# Patient Record
Sex: Female | Born: 1990 | Race: White | Hispanic: No | Marital: Married | Smoking: Never smoker
Health system: Southern US, Community
[De-identification: ages and names within clinical notes are randomized; demographics above are authoritative.]

## PROBLEM LIST (undated history)

## (undated) ENCOUNTER — Inpatient Hospital Stay: Payer: Self-pay

## (undated) DIAGNOSIS — G43909 Migraine, unspecified, not intractable, without status migrainosus: Secondary | ICD-10-CM

## (undated) DIAGNOSIS — E039 Hypothyroidism, unspecified: Secondary | ICD-10-CM

## (undated) HISTORY — DX: Migraine, unspecified, not intractable, without status migrainosus: G43.909

## (undated) HISTORY — PX: NO PAST SURGERIES: SHX2092

---
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
## 2009-06-05 ENCOUNTER — Other Ambulatory Visit: Payer: Self-pay | Admitting: Unknown Physician Specialty

## 2009-09-13 ENCOUNTER — Other Ambulatory Visit: Payer: Self-pay | Admitting: Unknown Physician Specialty

## 2010-03-13 ENCOUNTER — Other Ambulatory Visit: Payer: Self-pay | Admitting: Unknown Physician Specialty

## 2010-09-18 ENCOUNTER — Other Ambulatory Visit: Payer: Self-pay | Admitting: Unknown Physician Specialty

## 2011-04-01 ENCOUNTER — Other Ambulatory Visit: Payer: Self-pay | Admitting: Family Medicine

## 2011-12-01 ENCOUNTER — Other Ambulatory Visit: Payer: Self-pay | Admitting: Family Medicine

## 2011-12-01 LAB — TSH: Thyroid Stimulating Horm: 8.33 u[IU]/mL — ABNORMAL HIGH

## 2012-02-27 ENCOUNTER — Other Ambulatory Visit: Payer: Self-pay | Admitting: Family Medicine

## 2012-02-27 LAB — TSH: Thyroid Stimulating Horm: 0.472 u[IU]/mL

## 2012-11-16 ENCOUNTER — Other Ambulatory Visit: Payer: Self-pay | Admitting: Family Medicine

## 2012-11-16 LAB — TSH: Thyroid Stimulating Horm: 6.1 u[IU]/mL — ABNORMAL HIGH

## 2013-06-07 ENCOUNTER — Emergency Department: Payer: Self-pay | Admitting: Emergency Medicine

## 2013-06-07 LAB — CBC
HGB: 15.2 g/dL (ref 12.0–16.0)
MCH: 29.9 pg (ref 26.0–34.0)
Platelet: 172 10*3/uL (ref 150–440)
RBC: 5.11 10*6/uL (ref 3.80–5.20)
RDW: 13.2 % (ref 11.5–14.5)
WBC: 8.4 10*3/uL (ref 3.6–11.0)

## 2013-06-07 LAB — LIPASE, BLOOD: Lipase: 111 U/L (ref 73–393)

## 2013-06-07 LAB — COMPREHENSIVE METABOLIC PANEL
Albumin: 4.1 g/dL (ref 3.4–5.0)
Alkaline Phosphatase: 98 U/L (ref 50–136)
BUN: 11 mg/dL (ref 7–18)
Calcium, Total: 9.2 mg/dL (ref 8.5–10.1)
Chloride: 107 mmol/L (ref 98–107)
EGFR (African American): >60
Glucose: 91 mg/dL (ref 65–99)
Osmolality: 273 (ref 275–301)

## 2013-06-07 LAB — URINALYSIS, COMPLETE
Bilirubin,UR: NEGATIVE
Blood: NEGATIVE
Glucose,UR: NEGATIVE mg/dL (ref 0–75)
Leukocyte Esterase: NEGATIVE
Ph: 5 (ref 4.5–8.0)
Specific Gravity: 1.018 (ref 1.003–1.030)
Squamous Epithelial: 1
WBC UR: NONE SEEN /HPF (ref 0–5)

## 2013-06-17 ENCOUNTER — Ambulatory Visit: Payer: Self-pay | Admitting: Gastroenterology

## 2014-07-27 ENCOUNTER — Ambulatory Visit: Payer: Self-pay

## 2015-01-04 IMAGING — CT CT ABD-PELV W/ CM
2 of 4 series · 16 of 46 positions shown, 18 images · IV contrast (isovue)
Comparison: None.

CLINICAL DATA: Right upper quadrant pain and epigastric pain.
Nausea and vomiting.

EXAM:
CT ABDOMEN AND PELVIS WITH CONTRAST
TECHNIQUE: Multidetector CT imaging of the abdomen and pelvis was performed
using the standard protocol following bolus administration of
intravenous contrast.
CONTRAST:  100 cc Isovue 370

[Series 2: routine abd pel with · axial · 0.56mm/px · z∈[-956,-566]mm · 13 of 86 slices shown, 15 images]
[im 4/86  soft-tissue]
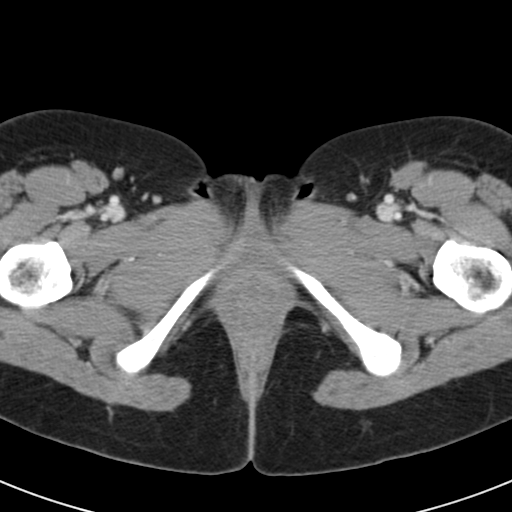
[im 4/86  bone]
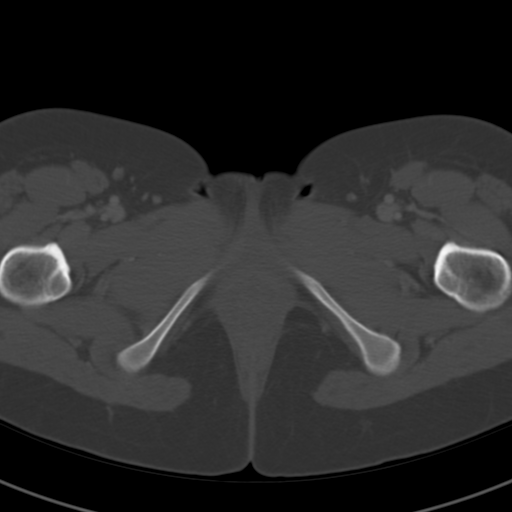
[im 11/86  soft-tissue]
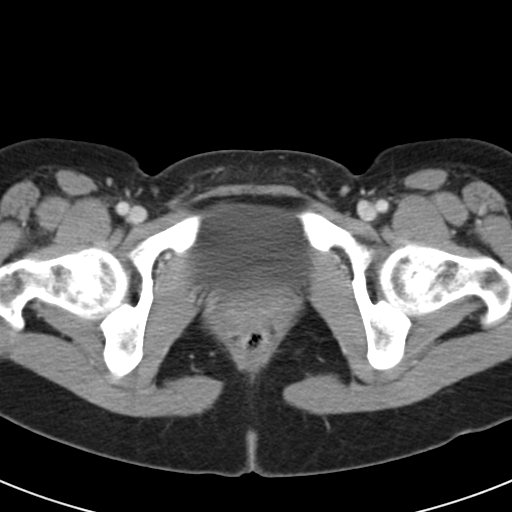
[im 18/86  soft-tissue]
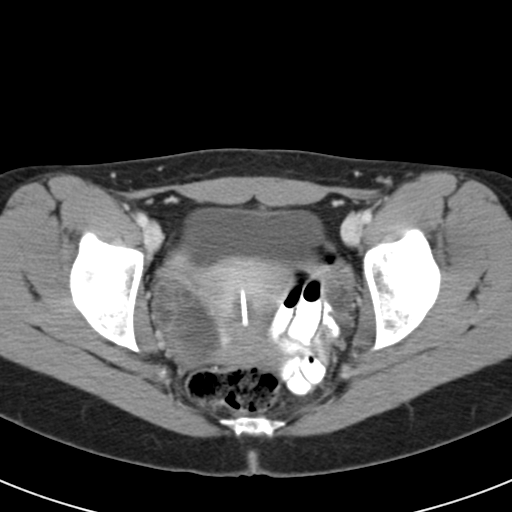
[im 24/86  soft-tissue]
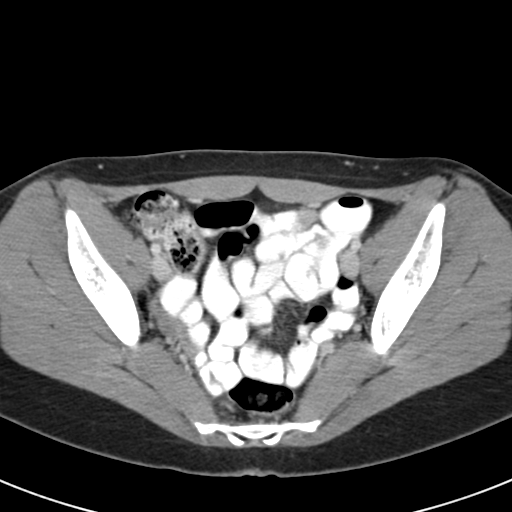
[im 31/86  soft-tissue]
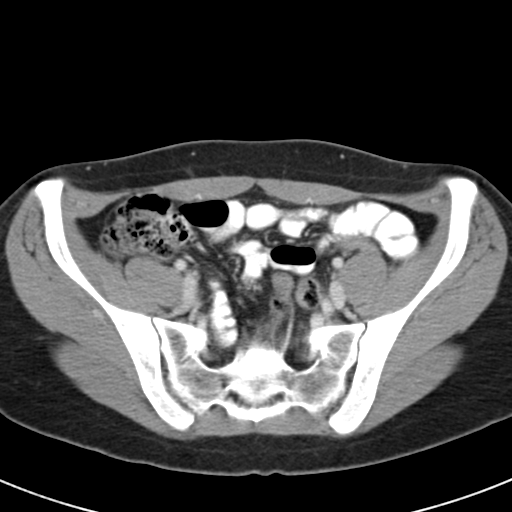
[im 38/86  soft-tissue]
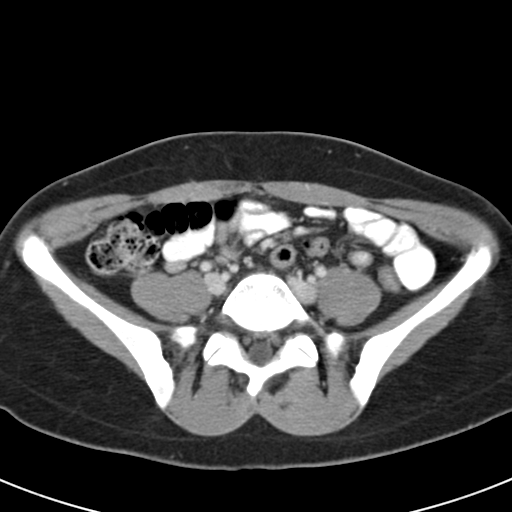
[im 45/86  soft-tissue]
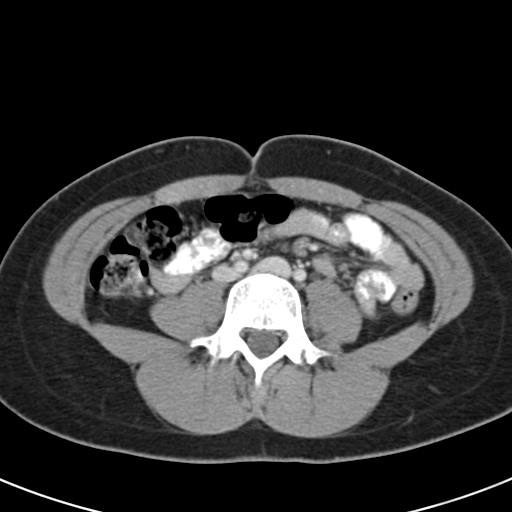
[im 48/86  soft-tissue]
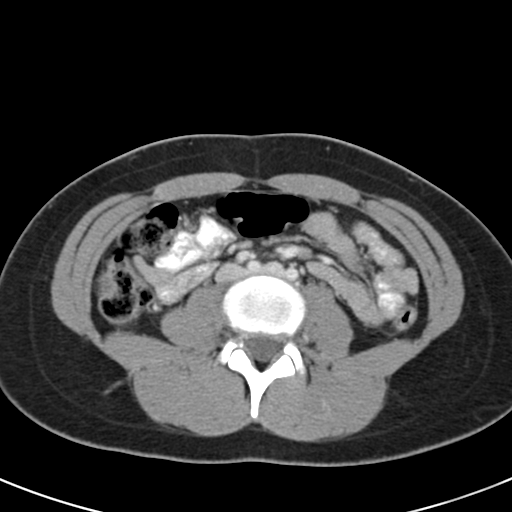
[im 55/86  soft-tissue]
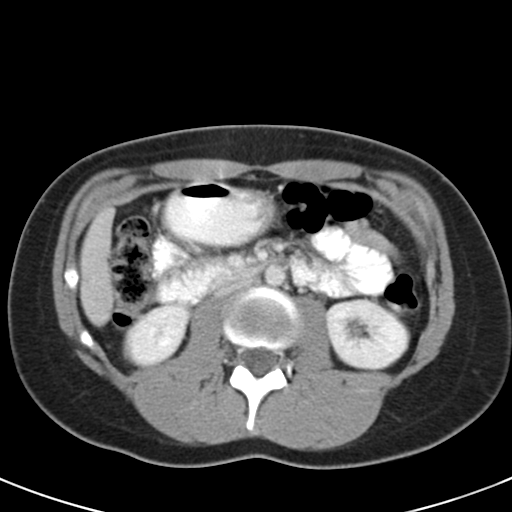
[im 55/86  bone]
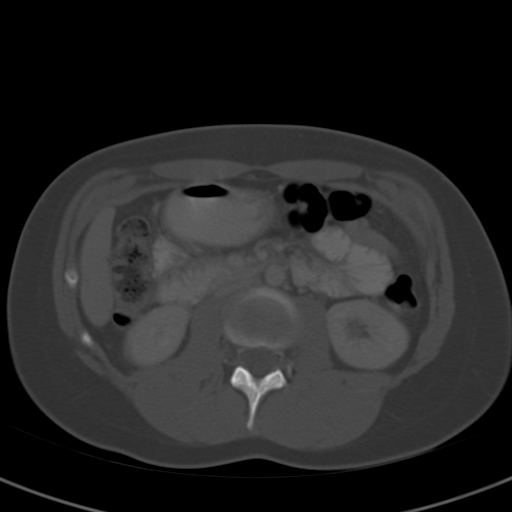
[im 62/86  soft-tissue]
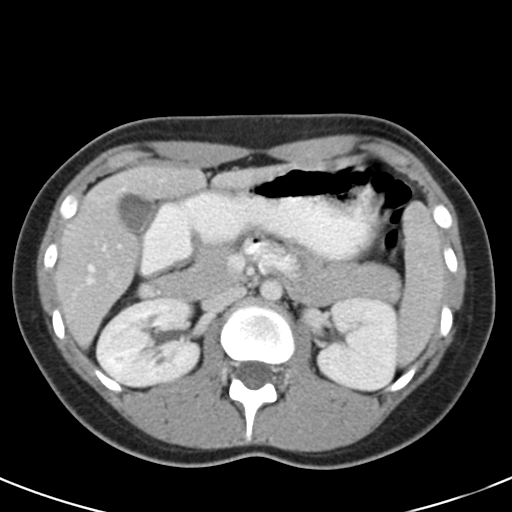
[im 69/86  soft-tissue]
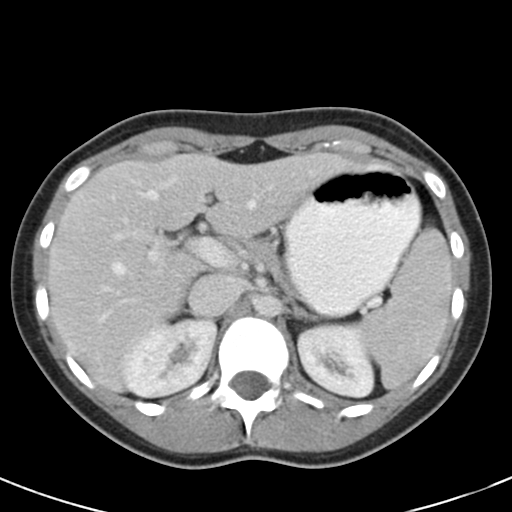
[im 75/86  soft-tissue]
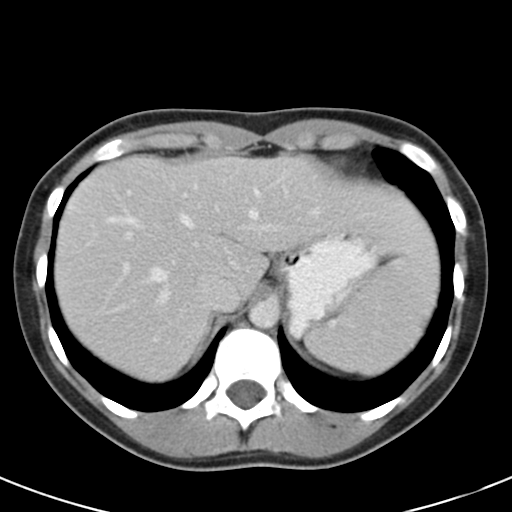
[im 82/86  soft-tissue]
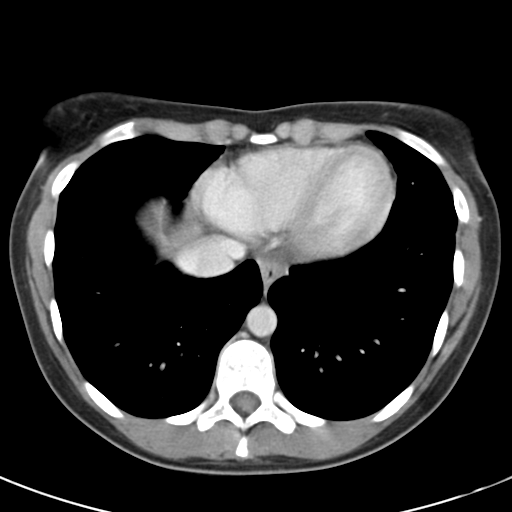

[Series 5: cor routine abd pel with · coronal · 0.57mm/px · 3 of 86 slices shown]
[im 29/86  soft-tissue]
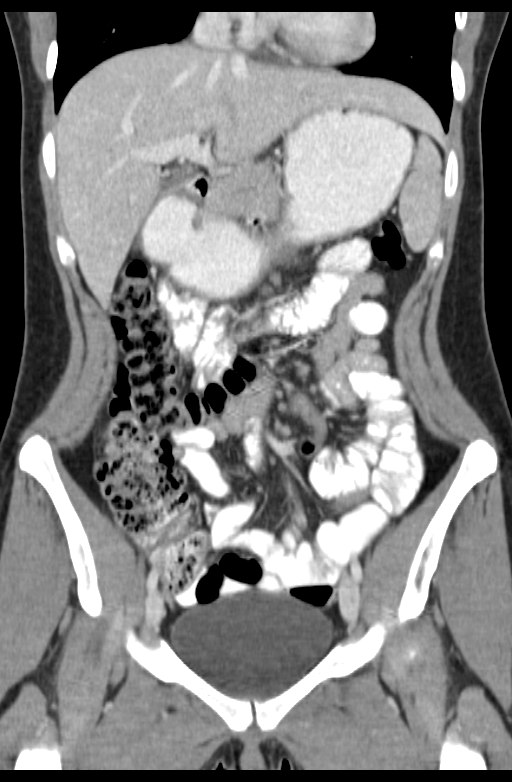
[im 38/86  soft-tissue]
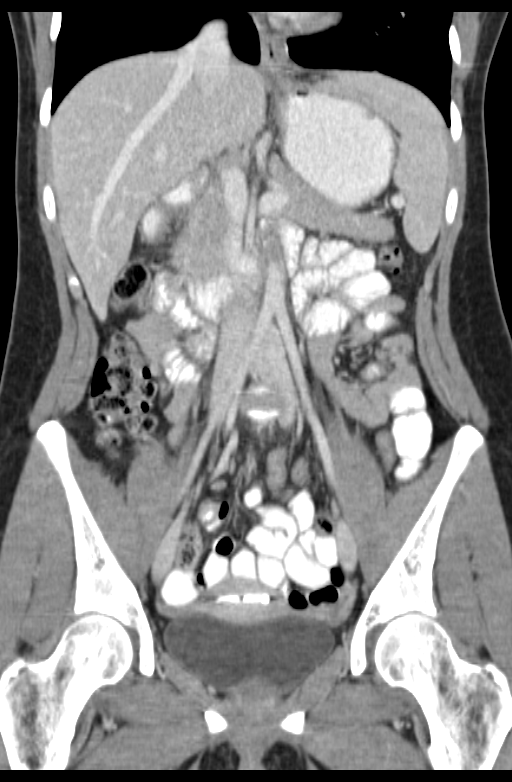
[im 48/86  soft-tissue]
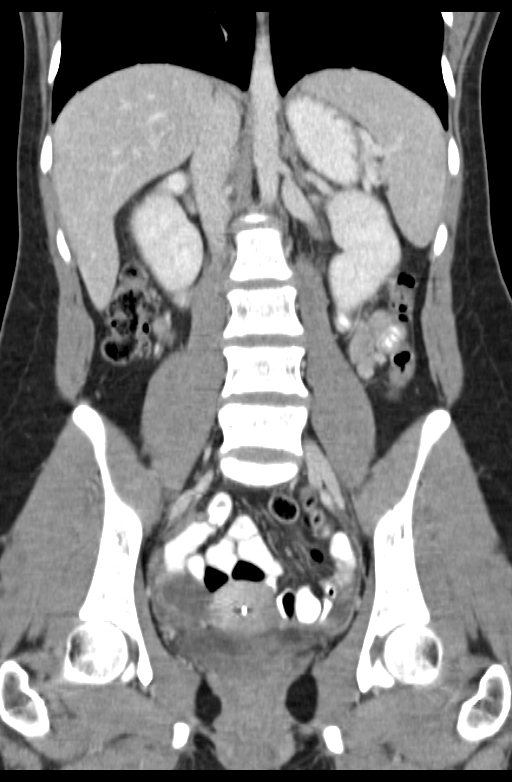

[16 of 46 positions shown; findings below may reference images not displayed]

FINDINGS: The liver, gallbladder, spleen, pancreas, adrenal glands, and
kidneys are normal in appearance. No evidence of hydronephrosis.

The IUD is seen the expected position in the uterus. A benign
appearing cyst is seen in the right adnexa measuring 2.7 x 4.1 cm.
No other cystic or solid masses are identified within the abdomen or
pelvis. No evidence of lymphadenopathy.

No evidence of inflammatory process or abnormal fluid collections.
No evidence of bowel wall thickening, dilatation, or hernia.
IMPRESSION: 4.1 cm benign appearing right adnexal cyst, most likely a functional
ovarian cyst. No further imaging followup required in a reproductive
age female. This recommendation follows ACR consensus guidelines:
White Paper of the ACR Incidental Findings Committee II on Adnexal
Findings. [HOSPITAL] [DATE].

IUD in normal location.

## 2015-01-14 IMAGING — NM NUCLEAR MEDICINE HEPATOHBILIARY INCLUDE GB
2 series · 12 of 12 positions shown · non-contrast
Comparison: CT abdomen pelvis and ultrasound abdomen 06/07/2013.

RADIOPHARMACEUTICALS:  X.GmBi Qc-11m Choletec

CLINICAL DATA: Right upper quadrant pain with nausea.

EXAM:
NUCLEAR MEDICINE HEPATOBILIARY IMAGING WITH GALLBLADDER EF
TECHNIQUE: Sequential images of the abdomen were obtained [DATE] minutes
following intravenous administration of radiopharmaceutical. After
oral ingestion of Ensure, gallbladder ejection fraction was
determined. At 60 min, normal ejection fraction is greater than 33%.

[Series 1000: gallbladder dynamic (results) · 4.80mm/px · 6 of 120 frames shown]
[frame 11/120]
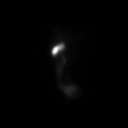
[frame 31/120]
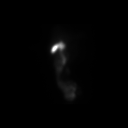
[frame 51/120]
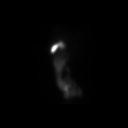
[frame 71/120]
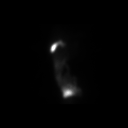
[frame 91/120]
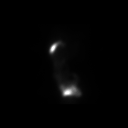
[frame 111/120]
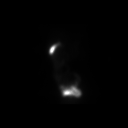

[Series 1000: gallbladder dynamic · 4.80mm/px · 6 of 120 frames shown]
[frame 11/120]
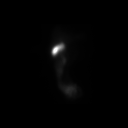
[frame 31/120]
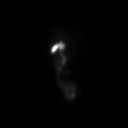
[frame 51/120]
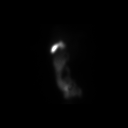
[frame 71/120]
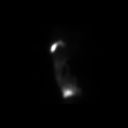
[frame 91/120]
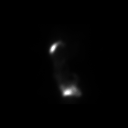
[frame 111/120]
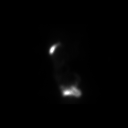

[12 of 12 positions shown; findings below may reference images not displayed]

FINDINGS: There is uniform uptake in the liver. Gallbladder is initially
visualized at 10 min. Bowel activity is seen at 50 min. Gallbladder
ejection fraction: 55%. Normal gallbladder ejection fraction with
Ensure is greater than 33%. The patient did experience symptoms
after oral ingestion of Ensure.
IMPRESSION: No evidence of cholecystitis.  Normal gallbladder ejection fraction.

## 2015-05-25 LAB — OB RESULTS CONSOLE RUBELLA ANTIBODY, IGM: Rubella: IMMUNE

## 2015-05-25 LAB — OB RESULTS CONSOLE HIV ANTIBODY (ROUTINE TESTING): HIV: NONREACTIVE

## 2015-05-25 LAB — OB RESULTS CONSOLE VARICELLA ZOSTER ANTIBODY, IGG: Varicella: IMMUNE

## 2015-05-25 LAB — OB RESULTS CONSOLE GC/CHLAMYDIA
Chlamydia: NEGATIVE
GC PROBE AMP, GENITAL: NEGATIVE

## 2015-05-25 LAB — OB RESULTS CONSOLE RPR: RPR: NONREACTIVE

## 2015-05-25 LAB — OB RESULTS CONSOLE HEPATITIS B SURFACE ANTIGEN: Hepatitis B Surface Ag: NEGATIVE

## 2015-05-28 ENCOUNTER — Other Ambulatory Visit: Payer: Self-pay | Admitting: Obstetrics and Gynecology

## 2015-05-28 DIAGNOSIS — Z369 Encounter for antenatal screening, unspecified: Secondary | ICD-10-CM

## 2015-06-11 ENCOUNTER — Ambulatory Visit (HOSPITAL_BASED_OUTPATIENT_CLINIC_OR_DEPARTMENT_OTHER)
Admission: RE | Admit: 2015-06-11 | Discharge: 2015-06-11 | Disposition: A | Discharge status: Home / Self Care | Payer: 59 | Source: Ambulatory Visit | Attending: Maternal & Fetal Medicine | Admitting: Maternal & Fetal Medicine

## 2015-06-11 ENCOUNTER — Ambulatory Visit
Admission: RE | Admit: 2015-06-11 | Discharge: 2015-06-11 | Disposition: A | Discharge status: Home / Self Care | Payer: 59 | Source: Ambulatory Visit | Attending: Obstetrics and Gynecology | Admitting: Obstetrics and Gynecology

## 2015-06-11 VITALS — BP 126/74 | HR 123 | Temp 97.9°F | Ht 66.0 in | Wt 147.0 lb

## 2015-06-11 DIAGNOSIS — Z8279 Family history of other congenital malformations, deformations and chromosomal abnormalities: Secondary | ICD-10-CM

## 2015-06-11 DIAGNOSIS — Z3491 Encounter for supervision of normal pregnancy, unspecified, first trimester: Secondary | ICD-10-CM | POA: Insufficient documentation

## 2015-06-11 DIAGNOSIS — Z369 Encounter for antenatal screening, unspecified: Secondary | ICD-10-CM | POA: Insufficient documentation

## 2015-06-11 DIAGNOSIS — Z36 Encounter for antenatal screening of mother: Secondary | ICD-10-CM

## 2015-06-11 HISTORY — DX: Hypothyroidism, unspecified: E03.9

## 2015-06-11 NOTE — Addendum Note (Signed)
Encounter addended by: Katrina Stackeborah Mckenzi Buonomo on: 06/11/2015  3:27 PM<BR>     Documentation filed: Notes Section

## 2015-06-11 NOTE — Progress Notes (Addendum)
Referring physician:  [facility] Length of Consultation: 30 minutes   Penny Ponce. Penny Ponce  was referred to [facility] for genetic counseling to review prenatal screening and testing options.  This note summarizes the information we discussed.  The patient was counseled by Penny Ponce, genetic counseling intern, supervised by Penny Stackeborah Clorine Swing, Penny Ponce, Penny Ponce.  First trimester screening, which can include nuchal translucency ultrasound screen and/or first trimester maternal serum marker screening.  The nuchal translucency has approximately an 80% detection rate for Down syndrome and can be positive for other chromosome abnormalities as well as congenital heart defects.  When combined with a maternal serum marker screening, the detection rate is up to 90% for Down syndrome and up to 97% for trisomy 18.     Maternal serum marker screening, a blood test that measures pregnancy proteins, can provide risk assessments for Down syndrome, trisomy 18, and open neural tube defects (spina bifida, anencephaly). Because it does not directly examine the fetus, it cannot positively diagnose or rule out these problems.  Targeted ultrasound uses high frequency sound waves to create an image of the developing fetus.  An ultrasound is often recommended as a routine means of evaluating the pregnancy.  It is also used to screen for fetal anatomy problems (for example, a heart defect) that might be suggestive of a chromosomal or other abnormality.   Should these screening tests indicate an increased concern, then the following diagnostic options would be offered:  The chorionic villus sampling procedure is available for first trimester chromosome analysis.  This involves the withdrawal of a small amount of chorionic villi (tissue from the developing placenta).  Risk of pregnancy loss is estimated to be approximately 1 in 200 to 1 in 100 (0.5 to 1%).  There is approximately a 1% (1 in 100) chance that the CVS  chromosome results will be unclear.  Chorionic villi cannot be tested for neural tube defects.     Amniocentesis involves the removal of a small amount of amniotic fluid from the sac surrounding the fetus with the use of a thin needle inserted through the maternal abdomen and uterus.  Ultrasound guidance is used throughout the procedure.  Fetal cells from amniotic fluid are directly evaluated and > 99.5% of chromosome problems and > 98% of open neural tube defects can be detected. This procedure is generally performed after the 15th week of pregnancy.  The main risks to this procedure include complications leading to miscarriage in less than 1 in 200 cases (0.5%).  As another option for information if the pregnancy is suspected to be an an increased chance for certain chromosome conditions, we also reviewed the availability of cell free fetal DNA testing from maternal blood to determine whether or not the baby may have either Down syndrome, trisomy 7013, or trisomy 6418.  This test utilizes a maternal blood sample and DNA sequencing technology to isolate circulating cell free fetal DNA from maternal plasma.  The fetal DNA can then be analyzed for DNA sequences that are derived from the three most common chromosomes involved in aneuploidy, chromosomes 13, 18, and 21.  If the overall amount of DNA is greater than the expected level for any of these chromosomes, aneuploidy is suspected.  While we do not consider it a replacement for invasive testing and karyotype analysis, a negative result from this testing would be reassuring, though not a guarantee of a normal chromosome complement for the baby.  An abnormal result is certainly suggestive of an  abnormal chromosome complement, though we would still recommend CVS or amniocentesis to confirm any findings from this testing.   Cystic Fibrosis screening was also discussed with the patient. Cystic fibrosis (CF) is one of the most common genetic conditions in persons of  Caucasian ancestry.  This condition occurs in approximately 1 in 2,500 Caucasian persons and results in thickened secretions in the lungs, digestive, and reproductive systems.  For a baby to be at risk for having CF, both of the parents must be carriers for this condition.  Approximately 1 in 21 Caucasian persons is a carrier for CF.  Current carrier testing looks for the most common mutations in the gene for CF and can detect approximately 90% of carriers in the Caucasian population.  This means that the carrier screening can greatly reduce, but cannot eliminate, the chance for an individual to have a child with CF.  If an individual is found to be a carrier for CF, then carrier testing would be available for the partner. As part of [place]'s newborn screening profile, all babies born in the state of [place] will have a two-tier screening process.  Specimens are first tested to determine the concentration of immunoreactive trypsinogen (IRT).  The top 5% of specimens with the highest IRT values then undergo DNA testing using a panel of over 40 common CF mutations.   We obtained a detailed family history and pregnancy history.  [name] has a first cousin once removed born with cleft lip and palate that, based on patient report, is likely isolated, as he has no other health concerns or developmental differences suggestive of a genetic syndrome. In the absence of a known genetic syndrome, cleft lip and palate are thought to be multifactorial, or caused by a combination of genetic and environmental factors. With this being a fifth degree relative to this pregnancy, there is not expected to be a significantly increased risk for cleft lip and palate. We reminded her that a second trimester ultrasound will attempt to look at the fetal nose and mouth for any indication of cleft lip and that cleft palate can be assessed at birth. There was no other family history of birth defects, developmental  differences, recurrent pregnancy loss or known chromosome abnormalities.  Penny Ponce. [place] stated that this is her first pregnancy.  She reported no complications or exposures that would be expected to increase the risk for birth defects.  After consideration of the options, Penny Ponce. [place] elected to proceed with first trimester screening and to decline CF carrier testing.  An ultrasound was performed at the time of the visit.  The gestational age was consistent with  12 weeks.  Fetal anatomy could not be assessed due to early gestational age.  Please refer to the ultrasound report for details of that study.  Penny Ponce. [place]in was encouraged to call with questions or concerns.  We can be contacted at [phone].   Penny Anderson, Penny Ponce, Penny Ponce   I was immediately available and supervising.  Argentina Ponder, MD [facility]

## 2015-06-14 ENCOUNTER — Telehealth: Payer: Self-pay | Admitting: Obstetrics and Gynecology

## 2015-06-14 NOTE — Telephone Encounter (Signed)
  Ms. Christell ConstantMoore elected to undergo First Trimester screening on June 11, 2015.  To review, first trimester screening, includes nuchal translucency ultrasound screen and/or first trimester maternal serum marker screening.  The nuchal translucency has approximately an 80% detection rate for Down syndrome and can be positive for other chromosome abnormalities as well as heart defects.  When combined with a maternal serum marker screening, the detection rate is up to 90% for Down syndrome and up to 97% for trisomy 13 and 18.     The results of the First Trimester Nuchal Translucency and Biochemical Screening were within normal range.  The risk for Down syndrome is now estimated to be less than 1 in 10,000.  The risk for Trisomy 13/18 is also estimated to be less than 1 in 10,000.  Should more definitive information be desired, we would offer amniocentesis.  Because we do not yet know the effectiveness of combined first and second trimester screening, we do not recommend a maternal serum screen to assess the chance for chromosome conditions.  However, if screening for neural tube defects is desired, maternal serum screening for AFP only can be performed between 15 and [redacted] weeks gestation.    Cherly Andersoneborah F. Davida Falconi, MS, CGC

## 2015-08-05 NOTE — L&D Delivery Note (Addendum)
Delivery Note  First Stage: Labor onset: 11/24/15 @1630   Augmentation : AROM Analgesia /Anesthesia intrapartum: none AROM at 0852 - clear bloody fluid   Second Stage: Complete dilation at 1234 Onset of pushing at 1234 FHR second stage Baseline: 115 bpm / moderate variability/ +accels/ variable decels to nadir of 70 bpm with pushing with good return to baseline   Delivery of a viable female "Brantley" at [id number] by Carlean JewsMeredith Analiese Krupka, CNM in compound right hand and ROA position Loose nuchal cord x 1 and reduced over head  Cord double clamped after cessation of pulsation, cut by FOB Cord blood sample collected  Collection of cord blood donation attempted   Third Stage: Placenta delivered via Tomasa BlaseSchultz intact with 3 VC @ 1256 Placenta disposition: hospital disposal Uterine tone boggy with massage / bleeding brisk but slowed with fundal massage - IV Pitocin 500cc bolus infusing an 800mcg Cytotec given rectally   Right labia minora laceration identified  Anesthesia for repair: 1% Lidocaine 30 mL  Repair 3.0 Vicryl SH  - 5 interrupted stitches placed Est. Blood Loss (mL): 350 mL  Complications: none  Mom to postpartum.  Baby to NICU after 30 minutes in the delivery room due to respiratory distress   Newborn: Birth Weight: 2800 grams (6#2.8oz) Apgar Scores: 7, 8 Feeding planned:Breast  Carlean JewsMeredith Aureliano Oshields, CNM

## 2015-09-19 ENCOUNTER — Observation Stay
Admission: RE | Admit: 2015-09-19 | Discharge: 2015-09-19 | Disposition: A | Discharge status: Home / Self Care | Payer: 59 | Source: Intra-hospital | Attending: Obstetrics and Gynecology | Admitting: Obstetrics and Gynecology

## 2015-09-19 DIAGNOSIS — O26899 Other specified pregnancy related conditions, unspecified trimester: Secondary | ICD-10-CM

## 2015-09-19 DIAGNOSIS — Z369 Encounter for antenatal screening, unspecified: Secondary | ICD-10-CM

## 2015-09-19 DIAGNOSIS — R109 Unspecified abdominal pain: Secondary | ICD-10-CM

## 2015-09-19 DIAGNOSIS — Z3A26 26 weeks gestation of pregnancy: Secondary | ICD-10-CM | POA: Diagnosis not present

## 2015-09-19 DIAGNOSIS — Z8279 Family history of other congenital malformations, deformations and chromosomal abnormalities: Secondary | ICD-10-CM

## 2015-09-19 LAB — URINALYSIS COMPLETE WITH MICROSCOPIC (ARMC ONLY)
Bilirubin Urine: NEGATIVE
GLUCOSE, UA: NEGATIVE mg/dL
HGB URINE DIPSTICK: NEGATIVE
Ketones, ur: NEGATIVE mg/dL
Leukocytes, UA: NEGATIVE
Nitrite: NEGATIVE
PROTEIN: NEGATIVE mg/dL
RBC / HPF: NONE SEEN RBC/hpf (ref 0–5)
Specific Gravity, Urine: 1.008 (ref 1.005–1.030)
pH: 9 — ABNORMAL HIGH (ref 5.0–8.0)

## 2015-09-19 MED ORDER — TERBUTALINE SULFATE 1 MG/ML IJ SOLN
INTRAMUSCULAR | Status: AC
  Administered 2015-09-19: 0.25 mg via SUBCUTANEOUS
  Filled 2015-09-19: qty 1

## 2015-09-19 MED ORDER — TERBUTALINE SULFATE 1 MG/ML IJ SOLN
0.2500 mg | Freq: Once | INTRAMUSCULAR | Status: AC
  Administered 2015-09-19: 0.25 mg via SUBCUTANEOUS

## 2015-09-19 NOTE — Progress Notes (Addendum)
Patient ID: Penny Ponce, female   DOB: 1991-06-13, 25 y.o.   MRN: [id number] DOTTY GONZALO 12/11/1990 G1 P0 [redacted]w[redacted]d presents for rt side pelvic pressure / pain  noLOF , no vaginal bleeding , O;LMP 03/17/2015 ABDsoft slight TTP , no rebound CX closed 50 % /-2 VTX in office . Recheck 1500 - no change  NSTctx noted. Subsided s/p SQ terbutaline  Labs: ua negative  A: preterm CTX , no cervical change  P:Pt will return  Tomorrow to L+D for toco and reeval for PTL .

## 2015-09-19 NOTE — Discharge Summary (Signed)
Discharge Summary Penny Ponce April 29, 1991 G1 P0 [redacted]w[redacted]d presents for rt side pelvic pressure / pain  noLOF , no vaginal bleeding , O;LMP 03/17/2015 ABDsoft slight TTP , no rebound CX closed 50 % /-2 VTX in office . Recheck 1500 - no change  NSTctx noted. Subsided s/p SQ terbutaline  Labs: ua negative  A: preterm CTX , no cervical change  P:Pt will return  Tomorrow to L+D for toco and reeval for PTL

## 2015-09-20 ENCOUNTER — Encounter: Payer: Self-pay | Admitting: *Deleted

## 2015-09-20 ENCOUNTER — Observation Stay
Admission: EM | Admit: 2015-09-20 | Discharge: 2015-09-20 | Disposition: A | Discharge status: Home / Self Care | Payer: 59 | Attending: Obstetrics and Gynecology | Admitting: Obstetrics and Gynecology

## 2015-09-20 DIAGNOSIS — Z369 Encounter for antenatal screening, unspecified: Secondary | ICD-10-CM

## 2015-09-20 DIAGNOSIS — O26899 Other specified pregnancy related conditions, unspecified trimester: Secondary | ICD-10-CM

## 2015-09-20 DIAGNOSIS — Z3A26 26 weeks gestation of pregnancy: Secondary | ICD-10-CM | POA: Diagnosis not present

## 2015-09-20 DIAGNOSIS — Z8279 Family history of other congenital malformations, deformations and chromosomal abnormalities: Secondary | ICD-10-CM

## 2015-09-20 DIAGNOSIS — R109 Unspecified abdominal pain: Secondary | ICD-10-CM

## 2015-09-20 LAB — FETAL FIBRONECTIN: Fetal Fibronectin: NEGATIVE

## 2015-09-20 NOTE — Discharge Instructions (Signed)
Pelvic rest: no sex or anything in vagina. Call your provider for any other concerns

## 2015-09-20 NOTE — OB Triage Note (Signed)
Patient here for NST and cervical check.

## 2015-09-20 NOTE — Discharge Summary (Signed)
  Patient ID: Penny Ponce, female DOB: Jul 16, 1991, 25 y.o. MRN: [id number] Penny Ponce [birth year] G1 P0 [redacted]w[redacted]d presents for f/up for CTX yesterday. Some cramping today noLOF , no vaginal bleeding , O;LMP 03/17/2015 ABDsoft NT  CX closed / 50 % / -2 VTX  NST150 reassuring for 26 weeks , no CTX  Labs: FFN- pending  A: no cervical change on f/up  P:pelvic rest  RTC if increasing pain

## 2015-11-23 ENCOUNTER — Inpatient Hospital Stay
Admission: EM | Admit: 2015-11-23 | Discharge: 2015-11-26 | DRG: 775 | Disposition: A | Discharge status: Home / Self Care | Payer: 59 | Attending: Obstetrics and Gynecology | Admitting: Obstetrics and Gynecology

## 2015-11-23 ENCOUNTER — Encounter: Payer: Self-pay | Admitting: *Deleted

## 2015-11-23 DIAGNOSIS — Z3A35 35 weeks gestation of pregnancy: Secondary | ICD-10-CM | POA: Diagnosis not present

## 2015-11-23 DIAGNOSIS — Z369 Encounter for antenatal screening, unspecified: Secondary | ICD-10-CM

## 2015-11-23 DIAGNOSIS — E039 Hypothyroidism, unspecified: Secondary | ICD-10-CM | POA: Diagnosis present

## 2015-11-23 DIAGNOSIS — O99284 Endocrine, nutritional and metabolic diseases complicating childbirth: Secondary | ICD-10-CM | POA: Diagnosis present

## 2015-11-23 DIAGNOSIS — Z8279 Family history of other congenital malformations, deformations and chromosomal abnormalities: Secondary | ICD-10-CM

## 2015-11-23 LAB — CBC
HEMATOCRIT: 36.8 % (ref 35.0–47.0)
HEMOGLOBIN: 12.2 g/dL (ref 12.0–16.0)
MCH: 27.7 pg (ref 26.0–34.0)
MCHC: 33.1 g/dL (ref 32.0–36.0)
MCV: 83.9 fL (ref 80.0–100.0)
Platelets: 174 10*3/uL (ref 150–440)
RBC: 4.38 MIL/uL (ref 3.80–5.20)
RDW: 14.6 % — AB (ref 11.5–14.5)
WBC: 10 10*3/uL (ref 3.6–11.0)

## 2015-11-23 LAB — TYPE AND SCREEN
ABO/RH(D): B NEG
ANTIBODY SCREEN: NEGATIVE

## 2015-11-23 LAB — ABO/RH: ABO/RH(D): B NEG

## 2015-11-23 MED ORDER — PENICILLIN G POTASSIUM 5000000 UNITS IJ SOLR
5.0000 10*6.[IU] | Freq: Once | INTRAVENOUS | Status: AC
  Administered 2015-11-23: 5 10*6.[IU] via INTRAVENOUS
  Filled 2015-11-23: qty 5

## 2015-11-23 MED ORDER — BETAMETHASONE SOD PHOS & ACET 6 (3-3) MG/ML IJ SUSP
12.0000 mg | Freq: Once | INTRAMUSCULAR | Status: AC
  Administered 2015-11-23: 12 mg via INTRAMUSCULAR
  Filled 2015-11-23: qty 1

## 2015-11-23 MED ORDER — LACTATED RINGERS IV SOLN
INTRAVENOUS | Status: DC
Start: 2015-11-23 — End: 2015-11-24
  Administered 2015-11-23 – 2015-11-24 (×2): via INTRAVENOUS

## 2015-11-23 MED ORDER — ACETAMINOPHEN 325 MG PO TABS: 650.0000 mg | ORAL_TABLET | ORAL | Status: DC | PRN

## 2015-11-23 MED ORDER — LIDOCAINE HCL (PF) 1 % IJ SOLN
30.0000 mL | INTRAMUSCULAR | Status: DC | PRN
  Administered 2015-11-24: 30 mL via SUBCUTANEOUS

## 2015-11-23 MED ORDER — PRENATAL MULTIVITAMIN CH
1.0000 | ORAL_TABLET | Freq: Every day | ORAL | Status: DC
  Filled 2015-11-23 (×2): qty 1

## 2015-11-23 MED ORDER — BUTORPHANOL TARTRATE 1 MG/ML IJ SOLN: 1.0000 mg | INTRAMUSCULAR | Status: DC | PRN

## 2015-11-23 MED ORDER — CITRIC ACID-SODIUM CITRATE 334-500 MG/5ML PO SOLN: 30.0000 mL | ORAL | Status: DC | PRN

## 2015-11-23 MED ORDER — LEVOTHYROXINE SODIUM 75 MCG PO TABS
150.0000 ug | ORAL_TABLET | Freq: Every day | ORAL | Status: DC
  Filled 2015-11-23 (×2): qty 1

## 2015-11-23 MED ORDER — OXYTOCIN 10 UNIT/ML IJ SOLN
INTRAMUSCULAR | Status: AC
  Filled 2015-11-23: qty 2

## 2015-11-23 MED ORDER — SODIUM CHLORIDE FLUSH 0.9 % IV SOLN
INTRAVENOUS | Status: AC
  Administered 2015-11-23: 10 mL
  Filled 2015-11-23: qty 10

## 2015-11-23 MED ORDER — OXYTOCIN BOLUS FROM INFUSION
500.0000 mL | INTRAVENOUS | Status: DC
  Administered 2015-11-24: 500 mL via INTRAVENOUS

## 2015-11-23 MED ORDER — OXYTOCIN 40 UNITS IN LACTATED RINGERS INFUSION - SIMPLE MED
2.5000 [IU]/h | INTRAVENOUS | Status: DC
  Filled 2015-11-23: qty 1000

## 2015-11-23 MED ORDER — MISOPROSTOL 200 MCG PO TABS
ORAL_TABLET | ORAL | Status: AC
  Administered 2015-11-24: 800 ug via VAGINAL
  Filled 2015-11-23: qty 4

## 2015-11-23 MED ORDER — LIDOCAINE HCL (PF) 1 % IJ SOLN
INTRAMUSCULAR | Status: AC
  Administered 2015-11-24: 30 mL via SUBCUTANEOUS
  Filled 2015-11-23: qty 30

## 2015-11-23 MED ORDER — AMMONIA AROMATIC IN INHA
RESPIRATORY_TRACT | Status: AC
  Filled 2015-11-23: qty 10

## 2015-11-23 MED ORDER — SODIUM CHLORIDE FLUSH 0.9 % IV SOLN
INTRAVENOUS | Status: AC
  Filled 2015-11-23: qty 10

## 2015-11-23 MED ORDER — PENICILLIN G POTASSIUM 5000000 UNITS IJ SOLR
2.5000 10*6.[IU] | INTRAMUSCULAR | Status: DC
  Administered 2015-11-23 – 2015-11-24 (×4): 2.5 10*6.[IU] via INTRAVENOUS
  Filled 2015-11-23 (×11): qty 2.5

## 2015-11-23 MED ORDER — LACTATED RINGERS IV SOLN: 500.0000 mL | INTRAVENOUS | Status: DC | PRN

## 2015-11-23 MED ORDER — ONDANSETRON HCL 4 MG/2ML IJ SOLN
4.0000 mg | Freq: Four times a day (QID) | INTRAMUSCULAR | Status: DC | PRN
  Administered 2015-11-24: 4 mg via INTRAVENOUS
  Filled 2015-11-23: qty 2

## 2015-11-23 NOTE — H&P (Signed)
OB ADMISSION/ HISTORY & PHYSICAL:  Admission Date: 11/23/2015  3:43 PM  Admit Diagnosis: Advanced cervical dilation at 35+6 weeks   Penny Ponce is a 25 y.o. female presenting for preterm labor with advanced cervical dilation at 35+6 weeks.  She was 4cm/80%/-1 with bloody show at the office today.   Prenatal History: G1P0   EDC : 12/22/2015, by Last Menstrual Period  [facility]  Prenatal course complicated by Acquired Hypothyroidism, RH negative, PTL    Prenatal Labs: ABO, Rh: --/--/B NEG (04/21 1623) Antibody: NEG (04/21 1622) Rubella:   Immune Varicella: Immune RPR:   NR HBsAg:   Negative HIV:   NR GTT: 145, 3 hr GTT-10/04/15-WNL (16,109,604,54(51,106,142,76) GBS:   unknown  Medical / Surgical History :  Past medical history:  Past Medical History  Diagnosis Date  . Hypothyroidism 2010     Past surgical history:  Past Surgical History  Procedure Laterality Date  . No past surgeries      Family History: No family history on file.   Social History:  reports that she has never smoked. She has never used smokeless tobacco. She reports that she does not drink alcohol or use illicit drugs.   Allergies: Review of patient's allergies indicates no known allergies.    Current Medications at time of admission:  Prior to Admission medications   Medication Sig Start Date End Date Taking? Authorizing Provider  cholecalciferol (VITAMIN D) 1000 UNITS tablet Take 1,000 Units by mouth daily.    Historical Provider, MD  doxylamine, Sleep, (UNISOM) 25 MG tablet Take 25 mg by mouth at bedtime as needed.    Historical Provider, MD  Ginger, Zingiber officinalis, (GINGER ROOT) 250 MG CAPS Take 250 mg by mouth.    Historical Provider, MD  levothyroxine (SYNTHROID, LEVOTHROID) 125 MCG tablet Take 125 mcg by mouth daily before breakfast.    Historical Provider, MD  Prenatal Vit-Fe Fumarate-FA (PRENATAL MULTIVITAMIN) TABS tablet Take 1 tablet by mouth daily at 12 noon.     Historical Provider, MD  vitamin B-6 (PYRIDOXINE) 25 MG tablet Take 25 mg by mouth daily.    Historical Provider, MD     Review of Systems: Active FM onset of ctx @  currently every 1-5 minutes No LOF bloody show present   Physical Exam:  VS: Blood pressure 127/80, pulse 112, temperature 98.2 F (36.8 C), temperature source Oral, resp. rate 18, height 5\' 5"  (1.651 m), weight 78.926 kg (174 lb), last menstrual period 03/17/2015.  General: alert and oriented, appears calm Heart: RRR Lungs: Clear lung fields Abdomen: Gravid, soft and non-tender, non-distended / uterus: non-tender Extremities: +2 BLE edema  Genitalia / VE:  4cm/80%/-1/vtx bloody show present  FHR: baseline rate 135 bpm / variability Moderate / accelerations + / no decelerations TOCO: every 1-5 minutes/ moderate  Assessment: 35+[redacted] weeks gestation Advanced Cervical Dilation Acquired Hypothyroidism  Rh Negative  FHR category 1   Plan:  1. Admit to Birth Place  - Routine labor and delivery orders   - May ambulate   - Continuous fetal monitoring   - May have Stadol 1mg  IVP every 1 hour PRN for pain  - May have epidural upon request  2. Rh Negative  - Evaluate for Rhogam PP 3. GBS Unknown   -Treat with IV Penicillin every 4 hours  4. Preterm Labor  - Betamethasone dose x 1 at 1615, plan for second dose if still pregnant  5. Acquired Hypothyroidism  - Take Synthroid 150mcg by mouth  every morning before breakfast  6. Anticipate NSVD  - EFW 4/21: 5#13oz   Dr. Feliberto Gottron notified of admission / plan of care  Carlean Jews, CNM

## 2015-11-23 NOTE — Progress Notes (Signed)
S:  Doing well, feeling "menstrual-like cramps" and increasing back pain       Ambulatory and intermittent birthing ball   O:  VS: Blood pressure 127/80, pulse 112, temperature 98.2 F (36.8 C), temperature source Oral, resp. rate 18, height 5\' 5"  (1.651 m), weight 78.926 kg (174 lb), last menstrual period 03/17/2015.        FHR : baseline 130 bpm / variability Moderate / accelerations + / no decelerations        Toco: contractions irregular / Mild to moderate        Cervix : Dilation: 5 Effacement (%): 90 Station: -1, 0 Presentation: Vertex Bloody show present  Exam by:: Sharyl NimrodMeredith, CNM        Membranes: Intact BBOW with ctxs  A: Latent labor     FHR category 1     Hypothyroidism - on Synthroid 150mcg     Preterm labor - s/p BMZ x 1 dose   P: Continue Expectant Management      Anticipate NSVD  Dr. Feliberto GottronSchermerhorn updated and agrees with plan of care  Carlean JewsMeredith Kairon Shock, CNM

## 2015-11-24 ENCOUNTER — Encounter: Payer: Self-pay | Admitting: *Deleted

## 2015-11-24 LAB — RPR: RPR: NONREACTIVE

## 2015-11-24 MED ORDER — OXYCODONE-ACETAMINOPHEN 5-325 MG PO TABS: 1.0000 | ORAL_TABLET | ORAL | Status: DC | PRN

## 2015-11-24 MED ORDER — BENZOCAINE-MENTHOL 20-0.5 % EX AERO
1.0000 "application " | INHALATION_SPRAY | CUTANEOUS | Status: DC | PRN
  Administered 2015-11-24: 1 via TOPICAL

## 2015-11-24 MED ORDER — SENNOSIDES-DOCUSATE SODIUM 8.6-50 MG PO TABS
2.0000 | ORAL_TABLET | ORAL | Status: DC
  Filled 2015-11-24: qty 2

## 2015-11-24 MED ORDER — IBUPROFEN 600 MG PO TABS
600.0000 mg | ORAL_TABLET | Freq: Four times a day (QID) | ORAL | Status: DC
  Administered 2015-11-24 – 2015-11-25 (×4): 600 mg via ORAL
  Filled 2015-11-24 (×4): qty 1

## 2015-11-24 MED ORDER — OXYCODONE-ACETAMINOPHEN 5-325 MG PO TABS: 2.0000 | ORAL_TABLET | ORAL | Status: DC | PRN

## 2015-11-24 MED ORDER — COCONUT OIL OIL
1.0000 "application " | TOPICAL_OIL | Status: DC | PRN
  Administered 2015-11-25: 1 via TOPICAL
  Filled 2015-11-24: qty 120

## 2015-11-24 MED ORDER — IBUPROFEN 600 MG PO TABS
600.0000 mg | ORAL_TABLET | Freq: Once | ORAL | Status: AC
Start: 2015-11-24 — End: 2015-11-24
  Administered 2015-11-24: 600 mg via ORAL

## 2015-11-24 MED ORDER — SODIUM CHLORIDE FLUSH 0.9 % IV SOLN
INTRAVENOUS | Status: AC
  Administered 2015-11-24: 10 mL
  Filled 2015-11-24: qty 10

## 2015-11-24 MED ORDER — DIBUCAINE 1 % RE OINT: 1.0000 "application " | TOPICAL_OINTMENT | RECTAL | Status: DC | PRN

## 2015-11-24 MED ORDER — BENZOCAINE-MENTHOL 20-0.5 % EX AERO
INHALATION_SPRAY | CUTANEOUS | Status: AC
  Administered 2015-11-24: 1 via TOPICAL
  Filled 2015-11-24: qty 56

## 2015-11-24 MED ORDER — ONDANSETRON HCL 4 MG PO TABS: 4.0000 mg | ORAL_TABLET | ORAL | Status: DC | PRN

## 2015-11-24 MED ORDER — PRENATAL MULTIVITAMIN CH
1.0000 | ORAL_TABLET | Freq: Every day | ORAL | Status: DC
  Filled 2015-11-24: qty 1

## 2015-11-24 MED ORDER — IBUPROFEN 600 MG PO TABS
ORAL_TABLET | ORAL | Status: AC
  Administered 2015-11-24: 600 mg via ORAL
  Filled 2015-11-24: qty 1

## 2015-11-24 MED ORDER — WITCH HAZEL-GLYCERIN EX PADS: 1.0000 "application " | MEDICATED_PAD | CUTANEOUS | Status: DC | PRN

## 2015-11-24 MED ORDER — ACETAMINOPHEN 325 MG PO TABS: 650.0000 mg | ORAL_TABLET | ORAL | Status: DC | PRN

## 2015-11-24 MED ORDER — SODIUM CHLORIDE FLUSH 0.9 % IV SOLN
INTRAVENOUS | Status: AC
  Filled 2015-11-24: qty 10

## 2015-11-24 MED ORDER — SIMETHICONE 80 MG PO CHEW: 80.0000 mg | CHEWABLE_TABLET | ORAL | Status: DC | PRN

## 2015-11-24 MED ORDER — ONDANSETRON HCL 4 MG/2ML IJ SOLN: 4.0000 mg | INTRAMUSCULAR | Status: DC | PRN

## 2015-11-24 MED ORDER — ZOLPIDEM TARTRATE 5 MG PO TABS: 5.0000 mg | ORAL_TABLET | Freq: Every evening | ORAL | Status: DC | PRN

## 2015-11-24 MED ORDER — DIPHENHYDRAMINE HCL 25 MG PO CAPS: 25.0000 mg | ORAL_CAPSULE | Freq: Four times a day (QID) | ORAL | Status: DC | PRN

## 2015-11-24 MED ORDER — MISOPROSTOL 200 MCG PO TABS
800.0000 ug | ORAL_TABLET | Freq: Once | ORAL | Status: AC
  Administered 2015-11-24: 800 ug via VAGINAL

## 2015-11-24 NOTE — Progress Notes (Signed)
S:  Ready to proceed with AROM and FSE placement   O:  VS: Blood pressure 126/75, pulse 102, temperature 97.9 F (36.6 C), temperature source Oral, resp. rate 16, height 5\' 5"  (1.651 m), weight 78.926 kg (174 lb), last menstrual period 03/17/2015, SpO2 97 %.        FHR : baseline 120 bpm / variability Moderate / accelerations + / no decelerations        Toco: contractions every 3-6 minutes / moderate        Cervix : 5cm/90%/-1/vtx        Membranes: AROM - clear fluid with bloody show  A: Latent labor     FHR category 1  P: Attempted to place FSE twice without success      RN attempted to place without success      Unable to adequately trace FHR - switched to external FHR - still low baseline 105-110 bpm - called Dr. Feliberto GottronSchermerhorn to come in to evaluate   Penny Ponce, CNM

## 2015-11-24 NOTE — Progress Notes (Signed)
S: Good fetal movement, has been ambulating and on birth ball  O:  VS: Blood pressure 126/75, pulse 102, temperature 97.9 F (36.6 C), temperature source Oral, resp. rate 16, height 5\' 5"  (1.651 m), weight 78.926 kg (174 lb), last menstrual period 03/17/2015, SpO2 97 %.        FHR : baseline 110-115 bpm / variabilityModerate / accelerations + / no decelerations        Toco: contractions every 3-4 minutes / mild-moderate        Cervix : 5/90/0/vtx        Membranes: intact  A: Latent labor     FHR category 1  P: Discussed with Dr. Feliberto GottronSchermerhorn - fetal baseline has decreased to 110-115 bpm -  Baseline will increase to 120 bpm at times with moderate variability and accelerations - he recommends AROM and placing fetal scalp electrode for better monitoring      Discussed plan of care with patient and family and she agrees   Penny JewsMeredith Josefita Ponce, CNM

## 2015-11-24 NOTE — Progress Notes (Signed)
Patient ID: Penny Ponce, female   DOB: 02-09-1991, 25 y.o.   MRN: [id number] Asked to see pt because of low baseline HR  110 . FSE attempted by CNM and RN . 36+0 being tx for unknown GBS and betamethasone for gestational age .  Painful ctx. No pain meds  O; VSS , mild tachycardia  Cx 4-5 cm  FSE and IUPC placed  FHR 110 ++ accels, no decels  A: reassuring fetal tracing despite low normal FHR  P: cont labor  Next betamethasone at 1600 today

## 2015-11-25 LAB — CBC
HCT: 33 % — ABNORMAL LOW (ref 35.0–47.0)
HEMOGLOBIN: 10.8 g/dL — AB (ref 12.0–16.0)
MCH: 27.3 pg (ref 26.0–34.0)
MCHC: 32.8 g/dL (ref 32.0–36.0)
MCV: 83 fL (ref 80.0–100.0)
Platelets: 193 10*3/uL (ref 150–440)
RBC: 3.98 MIL/uL (ref 3.80–5.20)
RDW: 14.3 % (ref 11.5–14.5)
WBC: 16.6 10*3/uL — AB (ref 3.6–11.0)

## 2015-11-25 LAB — FETAL SCREEN: FETAL SCREEN: NEGATIVE

## 2015-11-25 MED ORDER — IBUPROFEN 600 MG PO TABS
600.0000 mg | ORAL_TABLET | Freq: Four times a day (QID) | ORAL | Status: DC
  Administered 2015-11-25 – 2015-11-26 (×4): 600 mg via ORAL
  Filled 2015-11-25 (×4): qty 1

## 2015-11-25 MED ORDER — RHO D IMMUNE GLOBULIN 1500 UNIT/2ML IJ SOSY
300.0000 ug | PREFILLED_SYRINGE | Freq: Once | INTRAMUSCULAR | Status: AC
Start: 2015-11-25 — End: 2015-11-25
  Administered 2015-11-25: 300 ug via INTRAMUSCULAR
  Filled 2015-11-25: qty 2

## 2015-11-25 NOTE — Progress Notes (Signed)
Post Partum Day 1 Subjective: no complaints  Objective: Blood pressure 106/55, pulse 72, temperature 98 F (36.7 C), temperature source Oral, resp. rate 18, height 5\' 5"  (1.651 m), weight 174 lb (78.926 kg), last menstrual period 03/17/2015, SpO2 100 %, unknown if currently breastfeeding.  Physical Exam:  General: alert and cooperative Lochia: appropriate Uterine Fundus: firm Incision: no observed  DVT Evaluation: No evidence of DVT seen on physical exam.   Recent Labs  11/23/15 1622 11/25/15 0517  HGB 12.2 10.8*  HCT 36.8 33.0*    Assessment/Plan: Plan for discharge tomorrow  Rhogam today   LOS: 2 days   Penny Ponce 11/25/2015, 7:43 AM

## 2015-11-26 ENCOUNTER — Ambulatory Visit: Payer: Self-pay

## 2015-11-26 LAB — RHOGAM INJECTION: Unit division: 0

## 2015-11-26 MED ORDER — LEVOTHYROXINE SODIUM 150 MCG PO TABS: 150.0000 ug | ORAL_TABLET | Freq: Every day | ORAL | Status: DC

## 2015-11-26 NOTE — Lactation Note (Signed)
This note was copied from a baby's chart. Lactation Consultation Note  Patient Name: Penny Ponce [id number]'WToday's Date: 11/26/2015  Mom is consistently pumping for baby in SCN and is now expressing 10 to 12 ml.  He has been unable to go to the breast or receive any bottles d/t respiratory issues.  He has received mom's pumped colostrum and  transitional breast milk via tube.  Mom has already contacted her [employer] [employer] [facility] [facility] and [place] bag has already been [place] and is scheduled to arrive this Friday, April 28th.  Since mom is to be discharged today, Mom was loaned a [employer] pump to use at home until she receives her pump through insurance.    Maternal Data    Feeding    [facility] Score/Interventions                      Lactation Tools Discussed/Used     Consult Status      Louis MeckelWilliams, [name] 11/26/2015, 8:36 PM

## 2015-11-26 NOTE — Progress Notes (Signed)
D/C order from MD.  Reviewed d/c instructions and prescriptions with patient and answered any questions.  Patient d/c home via wheelchair by nursing/auxillary---infant remains in SCN. 

## 2015-11-26 NOTE — Discharge Instructions (Signed)
Please call your doctor or return to the ER if you experience any chest pains, shortness of breath, fever greater than 101, any heavy bleeding or large clots, and foul smelling vaginal discharge, any worsening abdominal pain & cramping that is not controlled by pain medication, or any signs of post partum depression.  No tampons, enemas, douches, or sexual intercourse for 6 weeks.  Also avoid tub baths, hot tubs, or swimming for 6 weeks. ° ° ° °Postpartum Care After Vaginal Delivery °After you deliver your newborn (postpartum period), the usual stay in the hospital is 24-72 hours. If there were problems with your labor or delivery, or if you have other medical problems, you might be in the hospital longer.  °While you are in the hospital, you will receive help and instructions on how to care for yourself and your newborn during the postpartum period.  °While you are in the hospital: °· Be sure to tell your nurses if you have pain or discomfort, as well as where you feel the pain and what makes the pain worse. °· If you had an incision made near your vagina (episiotomy) or if you had some tearing during delivery, the nurses may put ice packs on your episiotomy or tear. The ice packs may help to reduce the pain and swelling. °· If you are breastfeeding, you may feel uncomfortable contractions of your uterus for a couple of weeks. This is normal. The contractions help your uterus get back to normal size. °· It is normal to have some bleeding after delivery. °¨ For the first 1-3 days after delivery, the flow is red and the amount may be similar to a period. °¨ It is common for the flow to start and stop. °¨ In the first few days, you may pass some small clots. Let your nurses know if you begin to pass large clots or your flow increases. °¨ Do not  flush blood clots down the toilet before having the nurse look at them. °¨ During the next 3-10 days after delivery, your flow should become more watery and pink or  brown-tinged in color. °¨ Ten to fourteen days after delivery, your flow should be a small amount of yellowish-white discharge. °¨ The amount of your flow will decrease over the first few weeks after delivery. Your flow may stop in 6-8 weeks. Most women have had their flow stop by 12 weeks after delivery. °· You should change your sanitary pads frequently. °· Wash your hands thoroughly with soap and water for at least 20 seconds after changing pads, using the toilet, or before holding or feeding your newborn. °· You should feel like you need to empty your bladder within the first 6-8 hours after delivery. °· In case you become weak, lightheaded, or faint, call your nurse before you get out of bed for the first time and before you take a shower for the first time. °· Within the first few days after delivery, your breasts may begin to feel tender and full. This is called engorgement. Breast tenderness usually goes away within 48-72 hours after engorgement occurs. You may also notice milk leaking from your breasts. If you are not breastfeeding, do not stimulate your breasts. Breast stimulation can make your breasts produce more milk. °· Spending as much time as possible with your newborn is very important. During this time, you and your newborn can feel close and get to know each other. Having your newborn stay in your room (rooming in) will help to strengthen   the bond with your newborn.  It will give you time to get to know your newborn and become comfortable caring for your newborn. °· Your hormones change after delivery. Sometimes the hormone changes can temporarily cause you to feel sad or tearful. These feelings should not last more than a few days. If these feelings last longer than that, you should talk to your caregiver. °· If desired, talk to your caregiver about methods of family planning or contraception. °· Talk to your caregiver about immunizations. Your caregiver may want you to have the following  immunizations before leaving the hospital: °¨ Tetanus, diphtheria, and pertussis (Tdap) or tetanus and diphtheria (Td) immunization. It is very important that you and your family (including grandparents) or others caring for your newborn are up-to-date with the Tdap or Td immunizations. The Tdap or Td immunization can help protect your newborn from getting ill. °¨ Rubella immunization. °¨ Varicella (chickenpox) immunization. °¨ Influenza immunization. You should receive this annual immunization if you did not receive the immunization during your pregnancy. °  °This information is not intended to replace advice given to you by your health care provider. Make sure you discuss any questions you have with your health care provider. °  °Document Released: 05/18/2007 Document Revised: 04/14/2012 Document Reviewed: 03/17/2012 °Elsevier Interactive Patient Education ©2016 [employer] ° °

## 2015-11-26 NOTE — Discharge Summary (Signed)
Obstetric Discharge Summary   Patient ID: Marijo ConceptionLacey B Nocito MRN: [id number] DOB/AGE: [birth year] 26 y.o.   Date of Admission: 11/23/2015  Date of Discharge: 11/26/15  Admitting Diagnosis: Onset of Labor at 5354w0d  Secondary Diagnosis: Pre-term delivery  Mode of Delivery: normal spontaneous vaginal delivery     Discharge Diagnosis: NSVD of pre-term baby   Intrapartum Procedures: GBS prophylaxis   Post partum procedures: None  Complications: none and Baby in NICU with RDS   Brief Hospital Course  Marijo ConceptionLacey B Witte is a G1P0101 who had a SVD on 11/24/15;  for further details of this delivery, please refer to the delivery note.  Patient had an uncomplicated postpartum course.  By time of discharge on PPD#2, her pain was controlled on oral pain medications; she had appropriate lochia and was ambulating, voiding without difficulty and tolerating regular diet.  She was deemed stable for discharge to home.     Labs: CBC Latest Ref Rng 11/25/2015 11/23/2015 06/07/2013  WBC 3.6 - 11.0 K/uL 16.6(H) 10.0 8.4  Hemoglobin 12.0 - 16.0 g/dL 10.8(L) 12.2 15.2  Hematocrit 35.0 - 47.0 % 33.0(L) 36.8 43.5  Platelets 150 - 440 K/uL 193 174 172   B NEG  Physical exam:  Blood pressure 111/69, pulse 65, temperature 98 F (36.7 C), temperature source Oral, resp. rate 18, height 5\' 5"  (1.651 m), weight 174 lb (78.926 kg), last menstrual period 03/17/2015, SpO2 98 %, unknown if currently breastfeeding. General: alert and no distress Heart: S1S2, RRR, No M/R/G. Lungs: CTA bilatm No W/R/R. Lochia: appropriate Abdomen: soft, NT Uterine Fundus: firm Extremities: No evidence of DVT seen on physical exam. No lower extremity edema.  Discharge Instructions: Per After Visit Summary. Activity: Advance as tolerated. Pelvic rest for 6 weeks.  Also refer to After Visit Summary Diet: Regular Medications:   Medication List    ASK your doctor about these medications        cholecalciferol 1000 units tablet  Commonly  known as:  VITAMIN D  Take 1,000 Units by mouth daily.     doxylamine (Sleep) 25 MG tablet  Commonly known as:  UNISOM  Take 25 mg by mouth at bedtime as needed.     Ginger Root 250 MG Caps  Take 250 mg by mouth. Reported on 11/23/2015     levothyroxine 125 MCG tablet  Commonly known as:  SYNTHROID, LEVOTHROID  Take 125 mcg by mouth daily before breakfast.     prenatal multivitamin Tabs tablet  Take 1 tablet by mouth daily at 12 noon.     vitamin B-6 25 MG tablet  Commonly known as:  pyridOXINE  Take 25 mg by mouth daily.       Outpatient follow up:      Follow-up Information    Follow up In 6 weeks.     Postpartum contraception: IUD  Discharged Condition: good  Discharged to: home   Newborn Data:  Baby in the NICU being intubated and on vent today due to RDS  Disposition:IN NICU  Apgars: APGAR (1 MIN): 7   APGAR (5 MINS): 8   APGAR (10 MINS):    Baby Feeding: Breast  Sharee Pimplearon W Jones, CNM 11/26/2015

## 2018-08-04 NOTE — L&D Delivery Note (Signed)
Delivery Note:   G2P0101 at [redacted]w[redacted]d  Admitting diagnosis: water broke Risks: late preterm, Makena prophylaxis completed and BMZ inj x 2 at 35 weeks.  Onset of labor: PPROM 1917 on 8/28  Active labor 0500 8/29 Augmentation: none  Complete dilation at 04/02/2019  0930 Onset of pushing at 0925, anterior lip reduced over 2 contractions FHR second stage 1  Analgesia /Anesthesia intrapartum:None  Pushing in R lateral position with CNM and L&D staff support, FOB present for birth and supportive.  Delivery of a Live born female  Birth Weight:  pending APGAR: 49, 9  Newborn Delivery   Birth date/time: 04/02/2019 09:35:00 Delivery type: Vaginal, Spontaneous      in cephalic presentation, position OA to ROT.   Nuchal Cord: , baby delivered through somersault maneuver, then nuchal cord reduced after birth. Cord double clamped after cessation of pulsation, cut by FOB.  Collection of cord blood for typing completed. Cord blood donation-None  Arterial cord blood sample-No    Placenta delivered-Spontaneous  with 3 vessels . Uterotonics: Pitocin 10 units IM resolving intermittent atony Placenta delivered Penny Ponce with minimal traction under maternal expulsion effort, to L&D for disposal. Uterine tone firm, bleeding small  None  laceration identified.  Episiotomy:None  Local analgesia: none  Repair: Est. Blood Loss (SP):233.00   Complications: None  APGAR:1 min-8 , 5 min-9    Mom to postpartum.  Baby to Couplet care / Skin to Skin.  Delivery Report:  Review the Delivery Report for details.     Signed: Juliene Pina, CNM, MSN 04/02/2019, 9:58 AM

## 2018-09-27 LAB — OB RESULTS CONSOLE RPR: RPR: NONREACTIVE

## 2018-09-27 LAB — OB RESULTS CONSOLE HIV ANTIBODY (ROUTINE TESTING): HIV: NONREACTIVE

## 2018-09-27 LAB — OB RESULTS CONSOLE RUBELLA ANTIBODY, IGM: Rubella: IMMUNE

## 2018-09-27 LAB — OB RESULTS CONSOLE HEPATITIS B SURFACE ANTIGEN: Hepatitis B Surface Ag: NEGATIVE

## 2018-10-11 LAB — OB RESULTS CONSOLE GC/CHLAMYDIA
Chlamydia: NEGATIVE
Gonorrhea: NEGATIVE

## 2019-03-18 ENCOUNTER — Inpatient Hospital Stay (HOSPITAL_COMMUNITY)
Admission: AD | Admit: 2019-03-18 | Discharge: 2019-03-21 | DRG: 833 | Disposition: A | Discharge status: Home / Self Care | Payer: No Typology Code available for payment source | Attending: Obstetrics & Gynecology | Admitting: Obstetrics & Gynecology

## 2019-03-18 ENCOUNTER — Other Ambulatory Visit: Payer: Self-pay

## 2019-03-18 ENCOUNTER — Encounter (HOSPITAL_COMMUNITY): Payer: Self-pay | Admitting: *Deleted

## 2019-03-18 DIAGNOSIS — Z20828 Contact with and (suspected) exposure to other viral communicable diseases: Secondary | ICD-10-CM | POA: Diagnosis present

## 2019-03-18 DIAGNOSIS — O4703 False labor before 37 completed weeks of gestation, third trimester: Secondary | ICD-10-CM | POA: Diagnosis present

## 2019-03-18 DIAGNOSIS — O26893 Other specified pregnancy related conditions, third trimester: Secondary | ICD-10-CM | POA: Diagnosis present

## 2019-03-18 DIAGNOSIS — Z3A34 34 weeks gestation of pregnancy: Secondary | ICD-10-CM

## 2019-03-18 DIAGNOSIS — O99283 Endocrine, nutritional and metabolic diseases complicating pregnancy, third trimester: Secondary | ICD-10-CM | POA: Diagnosis present

## 2019-03-18 DIAGNOSIS — Z6791 Unspecified blood type, Rh negative: Secondary | ICD-10-CM

## 2019-03-18 DIAGNOSIS — E039 Hypothyroidism, unspecified: Secondary | ICD-10-CM | POA: Diagnosis present

## 2019-03-18 NOTE — MAU Note (Signed)
About 1845 started having some ctxs. Laid down and they subsided. Ctxs then came back and have had stronger ctxs off and on. Had more than 5 ctxs in an hour. Seen in office today. Head low and cervix posterior, closed but thinned. Denies bleeding or LOF

## 2019-03-19 DIAGNOSIS — Z3A34 34 weeks gestation of pregnancy: Secondary | ICD-10-CM | POA: Diagnosis not present

## 2019-03-19 DIAGNOSIS — O99283 Endocrine, nutritional and metabolic diseases complicating pregnancy, third trimester: Secondary | ICD-10-CM | POA: Diagnosis present

## 2019-03-19 DIAGNOSIS — O4703 False labor before 37 completed weeks of gestation, third trimester: Secondary | ICD-10-CM | POA: Diagnosis present

## 2019-03-19 DIAGNOSIS — Z20828 Contact with and (suspected) exposure to other viral communicable diseases: Secondary | ICD-10-CM | POA: Diagnosis present

## 2019-03-19 DIAGNOSIS — Z6791 Unspecified blood type, Rh negative: Secondary | ICD-10-CM | POA: Diagnosis not present

## 2019-03-19 DIAGNOSIS — E039 Hypothyroidism, unspecified: Secondary | ICD-10-CM | POA: Diagnosis present

## 2019-03-19 DIAGNOSIS — O26893 Other specified pregnancy related conditions, third trimester: Secondary | ICD-10-CM | POA: Diagnosis present

## 2019-03-19 LAB — URINALYSIS, ROUTINE W REFLEX MICROSCOPIC
Bilirubin Urine: NEGATIVE
Glucose, UA: NEGATIVE mg/dL
Hgb urine dipstick: NEGATIVE
Ketones, ur: NEGATIVE mg/dL
Nitrite: NEGATIVE
Protein, ur: NEGATIVE mg/dL
Specific Gravity, Urine: 1.02 (ref 1.005–1.030)
pH: 6 (ref 5.0–8.0)

## 2019-03-19 LAB — SARS CORONAVIRUS 2 BY RT PCR (HOSPITAL ORDER, PERFORMED IN ~~LOC~~ HOSPITAL LAB): SARS Coronavirus 2: NEGATIVE

## 2019-03-19 LAB — OB RESULTS CONSOLE GBS: GBS: NEGATIVE

## 2019-03-19 MED ORDER — ACETAMINOPHEN 325 MG PO TABS
650.0000 mg | ORAL_TABLET | ORAL | Status: DC | PRN
  Administered 2019-03-19 (×2): 650 mg via ORAL
  Filled 2019-03-19 (×2): qty 2

## 2019-03-19 MED ORDER — CALCIUM CARBONATE ANTACID 500 MG PO CHEW
2.0000 | CHEWABLE_TABLET | ORAL | Status: DC | PRN
  Administered 2019-03-20: 400 mg via ORAL
  Filled 2019-03-19: qty 2

## 2019-03-19 MED ORDER — LEVOTHYROXINE SODIUM 25 MCG PO TABS: 150.0000 ug | ORAL_TABLET | Freq: Every day | ORAL | Status: DC

## 2019-03-19 MED ORDER — DOCUSATE SODIUM 100 MG PO CAPS
100.0000 mg | ORAL_CAPSULE | Freq: Every day | ORAL | Status: DC
  Filled 2019-03-19: qty 1

## 2019-03-19 MED ORDER — PRENATAL MULTIVITAMIN CH
1.0000 | ORAL_TABLET | Freq: Every day | ORAL | Status: DC
  Administered 2019-03-19: 1 via ORAL
  Filled 2019-03-19: qty 1

## 2019-03-19 MED ORDER — BETAMETHASONE SOD PHOS & ACET 6 (3-3) MG/ML IJ SUSP
12.0000 mg | Freq: Once | INTRAMUSCULAR | Status: AC
  Administered 2019-03-20: 12 mg via INTRAMUSCULAR
  Filled 2019-03-19: qty 2

## 2019-03-19 MED ORDER — NIFEDIPINE 10 MG PO CAPS
30.0000 mg | ORAL_CAPSULE | Freq: Once | ORAL | Status: AC
  Administered 2019-03-19: 30 mg via ORAL
  Filled 2019-03-19: qty 3

## 2019-03-19 MED ORDER — ZOLPIDEM TARTRATE 5 MG PO TABS: 5.0000 mg | ORAL_TABLET | Freq: Every evening | ORAL | Status: DC | PRN

## 2019-03-19 MED ORDER — NIFEDIPINE 10 MG PO CAPS
10.0000 mg | ORAL_CAPSULE | Freq: Four times a day (QID) | ORAL | Status: DC
  Administered 2019-03-19 – 2019-03-21 (×9): 10 mg via ORAL
  Filled 2019-03-19 (×9): qty 1

## 2019-03-19 MED ORDER — LACTATED RINGERS IV BOLUS
1000.0000 mL | Freq: Once | INTRAVENOUS | Status: AC
  Administered 2019-03-19: 1000 mL via INTRAVENOUS

## 2019-03-19 MED ORDER — NIFEDIPINE 10 MG PO CAPS
10.0000 mg | ORAL_CAPSULE | ORAL | Status: DC | PRN
  Administered 2019-03-19 (×3): 10 mg via ORAL
  Filled 2019-03-19 (×3): qty 1

## 2019-03-19 MED ORDER — BETAMETHASONE SOD PHOS & ACET 6 (3-3) MG/ML IJ SUSP
12.0000 mg | Freq: Once | INTRAMUSCULAR | Status: AC
  Administered 2019-03-19: 12 mg via INTRAMUSCULAR
  Filled 2019-03-19: qty 2

## 2019-03-19 NOTE — MAU Note (Signed)
Covid swab obtained without difficulty and pt tol well. No symptoms 

## 2019-03-19 NOTE — MAU Provider Note (Signed)
History     CSN: [id number]  Arrival date and time: 03/18/19 2317   First Provider Initiated Contact with Patient 03/19/19 0041      Chief Complaint  Patient presents with  . Contractions    Penny Ponce is a 28 y.o. G2P0101 at [redacted]w[redacted]d who receives care at [facility].  She presents today for Contractions since 645pm. She states has been keeping her bladder emptying, resting, and hydrating prior to reaching out to Dr. Benjie Karvonen.  She states that she had a cervical exam today in the office, but was not dilated with a posterior cervical position.  Patient reports a history of cervical shortening and thinning since 24 weeks and has been on Makena injections. She reports BH contractions off and on, but have been unable to get them to go away tonight. She endorses fetal movement and denies vaginal bleeding, leaking, and discharge.      OB History    Gravida  2   Para  1   Term  0   Preterm  1   AB  0   Living  1     SAB  0   TAB  0   Ectopic  0   Multiple  0   Live Births  1           Past Medical History:  Diagnosis Date  . Hypothyroidism 2010    Past Surgical History:  Procedure Laterality Date  . NO PAST SURGERIES      No family history on file.  Social History   Tobacco Use  . Smoking status: Never Smoker  . Smokeless tobacco: Never Used  Substance Use Topics  . Alcohol use: No  . Drug use: No    Allergies: No Known Allergies  Medications Prior to Admission  Medication Sig Dispense Refill Last Dose  . hydroxyprogesterone caproate (MAKENA) 250 mg/mL OIL injection Inject 250 mg into the muscle once.   Past Week at Unknown time  . levothyroxine (SYNTHROID, LEVOTHROID) 150 MCG tablet Take 1 tablet (150 mcg total) by mouth daily before breakfast. 30 tablet 3 03/18/2019 at Unknown time  . Prenatal Vit-Fe Fumarate-FA (PRENATAL MULTIVITAMIN) TABS tablet Take 1 tablet by mouth daily at 12 noon.   03/18/2019 at Unknown time  . Probiotic Product (PROBIOTIC-10  PO) Take 1 capsule by mouth.     . cholecalciferol (VITAMIN D) 1000 UNITS tablet Take 1,000 Units by mouth daily.       Review of Systems  Constitutional: Negative for chills and fever.  Respiratory: Negative for cough and shortness of breath.   Gastrointestinal: Negative for constipation, diarrhea, nausea and vomiting.  Genitourinary: Negative for difficulty urinating, dysuria, vaginal bleeding and vaginal discharge.  Neurological: Negative for dizziness, light-headedness and headaches.   Physical Exam   Blood pressure 122/76, pulse (!) 122, temperature 98.3 F (36.8 C), resp. rate 18, height 5\' 5"  (1.651 m), weight 83 kg, unknown if currently breastfeeding.  Physical Exam  Constitutional: She is oriented to person, place, and time. She appears well-developed and well-nourished.  HENT:  Head: Normocephalic and atraumatic.  Eyes: Conjunctivae are normal.  Neck: Normal range of motion.  Cardiovascular: Normal rate.  Respiratory: Effort normal.  GI: Soft.  Genitourinary: Cervix exhibits no motion tenderness, no discharge and no friability.    No vaginal discharge or bleeding.  No bleeding in the vagina.    Genitourinary Comments: Sterile Speculum Exam: -Normal External Genitalia: Non tender, no apparent discharge at introitus.  -Vaginal Vault: Pink  mucosa with good rugae. Scant amt white discharge -Cervix:Pink, no lesions, cysts, or polyps.  Appears closed. No active bleeding from os -Bimanual Exam: Dilation: 1.5 Effacement (%): 50 Cervical Position: Anterior Station: -2 Presentation: Vertex Exam by:: Penny Ponce CNM    Musculoskeletal: Normal range of motion.  Neurological: She is alert and oriented to person, place, and time.  Skin: Skin is warm and dry.  Psychiatric: She has a normal mood and affect. Her behavior is normal.    Fetal Assessment 150 bpm, Mod Var, -Decels, +Accels Toco: Q1-345min, palpates mild  MAU Course   Results for orders placed or performed during  the hospital encounter of 03/18/19 (from the past 24 hour(s))  Urinalysis, Routine w reflex microscopic     Status: Abnormal   Collection Time: 03/19/19 12:02 AM  Result Value Ref Range   Color, Urine YELLOW YELLOW   APPearance HAZY (A) CLEAR   Specific Gravity, Urine 1.020 1.005 - 1.030   pH 6.0 5.0 - 8.0   Glucose, UA NEGATIVE NEGATIVE mg/dL   Hgb urine dipstick NEGATIVE NEGATIVE   Bilirubin Urine NEGATIVE NEGATIVE   Ketones, ur NEGATIVE NEGATIVE mg/dL   Protein, ur NEGATIVE NEGATIVE mg/dL   Nitrite NEGATIVE NEGATIVE   Leukocytes,Ua SMALL (A) NEGATIVE   RBC / HPF 0-5 0 - 5 RBC/hpf   WBC, UA 6-10 0 - 5 WBC/hpf   Bacteria, UA RARE (A) NONE SEEN   Squamous Epithelial / LPF 6-10 0 - 5   Mucus PRESENT    No results found.  MDM PE Labs:UA EFM Start IV LR Bolus Assessment and Plan  28 year old 602P0101  SIUP at 34.6weeks Cat I FT Contractions: Preterm  -Exam findings discussed. -Start IV, give LR Bolus -Procardia dosing per protocol -Informed that due to change in cervix would be beneficial to give BMZ dosing now and repeat in 24 hours. -Will monitor and reassess. -NST Reactive   Cherre RobinsJessica L Noheli Melder MSN, CNM 03/19/2019, 12:41 AM   Reassessment (3:27 AM) Contractions: Preterm Cervical Change   -Contractions graphing more frequently, appears more intense. -Patient reports no change in contractions. -Exam with minimal, but notable change. -Dr. Juliene PinaMody contacted and updated on patient status. Recommendation for admission made and she agrees.   Advises: *Procardia Q6 hrs starting from last dose; Next dose at 9am. *Continuous tocometry. *Continous monitoring until evaluated later today. *Regular diet -Patient updated on POC.  Encouraged to verbalize any change in pain or contractions to staff immediately. -No questions or concerns. -Admission/Observation orders placed with modifications per MD requests.  Cherre RobinsJessica L Jaleyah Longhi MSN, CNM

## 2019-03-19 NOTE — Progress Notes (Signed)
PT moves and sits up in bed often. Tracing maternal HR at times. Pt up to BR about 66mins ago before sve

## 2019-03-19 NOTE — Progress Notes (Signed)
Contact with Ailene Ravel, CNM in regards to patient's pulse remaining elevated. Pulse remains in the 120's-130's. Order received for an EKG. Will continue to monitor. Toya Smothers, RN

## 2019-03-19 NOTE — Progress Notes (Signed)
Penny Ponce, CMN at bedside. Provider has reviewed FHR tracing and contractions pattern. Ctx 2-3 apart with patient rating them 3/10 on pain scale. Patient also reporting that her "heart is racing". Pulse check with provider at bedside is 139. No new orders given at this time. Toya Smothers, RN

## 2019-03-19 NOTE — H&P (Addendum)
Penny Ponce is a 28 y.o. female presenting for preterm contractions at 34.6 wks. No LOF or vag bleeding. Good FMs. H/o PTD at 36 wks.  Serial cervical lengths normal CL 3 cm and weekly vaginal exams in 3rd trim have been closed cervix but pt repots head was low per CNM check yesterday.  She is self administering Makena wkly at home since 16 wks.   G2P0101, prior 36 wk delivery, NICU admission for RDS for 10 days.  Current pregnancy complicated by Hypothyroidism, on Levothyroxine 155mcg daily and additional 36mcg on Sundays Followed by Endocr at [place].  Rh neg, Rhogam in pregnancy. Took TDAP as well.   OB History    Gravida  2   Para  1   Term  0   Preterm  1   AB  0   Living  1     SAB  0   TAB  0   Ectopic  0   Multiple  0   Live Births  1          Past Medical History:  Diagnosis Date  . Hypothyroidism 2010   Past Surgical History:  Procedure Laterality Date  . NO PAST SURGERIES     Family History: family history is not on file. Social History:  reports that she has never smoked. She has never used smokeless tobacco. She reports that she does not drink alcohol or use drugs.     Maternal Diabetes: No Genetic Screening: Normal Maternal Ultrasounds/Referrals: Normal Fetal Ultrasounds or other Referrals:  None Maternal Substance Abuse:  No Significant Maternal Medications:  Meds include: Progesterone Syntroid Significant Maternal Lab Results:  Rh negative Other Comments:  None  ROS History  (+) contractions, some BH, some stronger - have spaced out since MAU per patient Denies LOF, VB, abnormal vaginal discharge, urinary complaints "heart racing" Dilation: 2.5 Effacement (%): 50 Station: -2 Exam by:: Gavin Pound CNM Blood pressure 117/66, pulse (!) 105, temperature 97.9 F (36.6 C), temperature source Oral, resp. rate 16, height 5\' 5"  (1.651 m), weight 83 kg, SpO2 100 %, unknown if currently breastfeeding. Exam Physical Exam  Vitals:   03/19/19 0501 03/19/19 0535  BP:  117/66  Pulse:  (!) 105  Resp:  16  Temp: 98.1 F (36.7 C) 97.9 F (36.6 C)  SpO2:  100%   A&O x 3, no acute distress. Pleasant HEENT neg, no thyromegaly Lungs CTA bilat CV: tachycardia, pulse 130s by pulse oximetry, S1S2 normal Abdo soft, non tender, non acute Extr no edema/ tenderness Pelvic changed from f.tip to 2.5 cm in MAU FHT  140s + accels no decels mod variab- cat I Toco q 3-5 min   Prenatal labs: ABO, Rh: --/--/B NEG (08/15 0348) Antibody: POS (08/15 0348) Rubella:  Imm RPR:   NR HBsAg:   Neg HIV:   Neg GBS:   Not done GLucola nl Ultrascreen neg AFP1 neg   Assessment/Plan: 28 yo G2P0101 at 34.6 wks, with preterm labor.  S/p Procardia 10mg  x 3 doses in MAU. Will continue 20mg  q 6 hrs until after 2nd dose of BMZ due to tachycardia  BTMZ x2 doses, 1st in MAU GBS pending CEFM  Continue close monitoring  Consult for plan: Dr. [place] Sink, CNM

## 2019-03-19 NOTE — Progress Notes (Signed)
FHR 155 when EFM removed for transfer to [facility]

## 2019-03-19 NOTE — Progress Notes (Signed)
Baby is very active

## 2019-03-20 MED ORDER — DOCUSATE SODIUM 100 MG PO CAPS
100.0000 mg | ORAL_CAPSULE | Freq: Every day | ORAL | Status: DC
  Filled 2019-03-20: qty 1

## 2019-03-20 MED ORDER — PRENATAL MULTIVITAMIN CH
1.0000 | ORAL_TABLET | Freq: Every day | ORAL | Status: DC
  Administered 2019-03-20 – 2019-03-21 (×2): 1 via ORAL
  Filled 2019-03-20 (×2): qty 1

## 2019-03-20 MED ORDER — ZOLPIDEM TARTRATE 5 MG PO TABS: 5.0000 mg | ORAL_TABLET | Freq: Every evening | ORAL | Status: DC | PRN

## 2019-03-20 MED ORDER — LEVOTHYROXINE SODIUM 25 MCG PO TABS: 75.0000 ug | ORAL_TABLET | ORAL | Status: DC

## 2019-03-20 MED ORDER — BUTORPHANOL TARTRATE 1 MG/ML IJ SOLN: 1.0000 mg | Freq: Once | INTRAMUSCULAR | Status: DC

## 2019-03-20 MED ORDER — CALCIUM CARBONATE ANTACID 500 MG PO CHEW
2.0000 | CHEWABLE_TABLET | ORAL | Status: DC | PRN
  Administered 2019-03-20: 400 mg via ORAL
  Filled 2019-03-20: qty 2

## 2019-03-20 MED ORDER — ACETAMINOPHEN 325 MG PO TABS: 650.0000 mg | ORAL_TABLET | ORAL | Status: DC | PRN

## 2019-03-20 NOTE — Progress Notes (Signed)
Patient ID: Penny Ponce, female   DOB: 16-Dec-1990, 28 y.o.   MRN: [id number] 35 wks PTL HD#2  S: UCs have picked up, no VB/ LOF O: BP 111/76 (BP Location: Right Arm)   Pulse (!) 110   Temp 97.8 F (36.6 C) (Oral)   Resp 18   Ht 5\' 5"  (1.651 m)   Wt 83 kg   SpO2 99%   BMI 30.45 kg/m  Tachycardia better Abdo soft, UCs palpated Cx- mid position, 2 cm/70%/-2/ Vx FHT 150s-160s + accels no decels mod variab- cat I Toco- irreg but painful intermittently.   A/P: 35 wks, preterm labor. H/o Preterm delivery at 36 wks with NICU admission.  FHT cat I PTL- S/p BTMZ, 2nd dose at 1.30 am today Procardia 20mg  q 6 hrs, stopping tonight at 24 hrs from 2nd BTMZ dose, continue Makena (takes on Wed) Prematurity risks reviewed. Lives 40 min from hospital. Will watch overnight, recheck Cx in AM and sooner if worse. GBS unkn, will treat with PCN in progressed Hypothyroidism, on scheduled Levothyroxine   V.Lilybelle Mayeda MD

## 2019-03-20 NOTE — Progress Notes (Signed)
Lars Pinks, CNM notified of fetal tracing from [id number].  Assessment of the strip appeared that [place] was tracing mothers heart rate.  Continuing to monitor strip and no orders received.

## 2019-03-20 NOTE — Plan of Care (Addendum)
Pt progressing well. Denies HA, bleeding, fluid leaking, or DFM. Pt on continuous monitoring without much difficulty. Ctx mild and intermittent. HR is still sinus tachycardic and pt denies chest pain, SOB, or dizziness. 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 2nd dose of BMZ was given in the Left upper outer quadrant at 0130. Pt denies feeling contractions in the last 12 hrs. Intermittent ctx on monitor. Will continue to monitor.

## 2019-03-21 LAB — CULTURE, BETA STREP (GROUP B ONLY)

## 2019-03-21 MED ORDER — NIFEDIPINE 10 MG PO CAPS: 10.0000 mg | ORAL_CAPSULE | Freq: Four times a day (QID) | ORAL | 0 refills | Status: DC

## 2019-03-21 NOTE — Progress Notes (Addendum)
ANTEPARTUM NOTE - Hospital Day #3 35+1 weeks PTL   Subjective: Reports feeling better today; reports painful ctxs yesterday at 11am and then at 4pm, but they resolved.  Rested well overnight with minimal discomfort, just occasional BH contractions  Tolerating po intake / no nausea / no vomiting  Bowel habits normal / Voiding QS Denies VB or LOF; reports "creamy" white d/c today, but denies itching/burning  Endorses good fetal movement      Objective: Vital signs: VS: Blood pressure (!) 92/55, pulse (!) 105, temperature (!) 97.5 F (36.4 C), temperature source Oral, resp. rate 18, height 5\' 5"  (1.651 m), weight 83 kg, SpO2 100 %, unknown if currently breastfeeding.  Labs: Results for orders placed or performed during the hospital encounter of 03/18/19 (from the past 72 hour(s))  Urinalysis, Routine w reflex microscopic     Status: Abnormal   Collection Time: 03/19/19 12:02 AM  Result Value Ref Range   Color, Urine YELLOW YELLOW   APPearance HAZY (A) CLEAR   Specific Gravity, Urine 1.020 1.005 - 1.030   pH 6.0 5.0 - 8.0   Glucose, UA NEGATIVE NEGATIVE mg/dL   Hgb urine dipstick NEGATIVE NEGATIVE   Bilirubin Urine NEGATIVE NEGATIVE   Ketones, ur NEGATIVE NEGATIVE mg/dL   Protein, ur NEGATIVE NEGATIVE mg/dL   Nitrite NEGATIVE NEGATIVE   Leukocytes,Ua SMALL (A) NEGATIVE   RBC / HPF 0-5 0 - 5 RBC/hpf   WBC, UA 6-10 0 - 5 WBC/hpf   Bacteria, UA RARE (A) NONE SEEN   Squamous Epithelial / LPF 6-10 0 - 5   Mucus PRESENT     Comment: Performed at [facility], [address], [place], [place] [address]  Type and screen [place]     Status: None (Preliminary result)   Collection Time: 03/19/19  3:48 AM  Result Value Ref Range   ABO/RH(D) B NEG    Antibody Screen POS    Sample Expiration 03/22/2019,2359    Antibody Identification      PASSIVELY ACQUIRED ANTI-D Performed at [facility], [place] [address], [place], [place] [address]    Unit Number  [id number]    Blood Component Type RBC LR PHER2    Unit division 00    Status of Unit ALLOCATED    Transfusion Status OK TO TRANSFUSE    Crossmatch Result COMPATIBLE    Unit Number [id number]    Blood Component Type RED CELLS,LR    Unit division 00    Status of Unit ALLOCATED    Transfusion Status OK TO TRANSFUSE    Crossmatch Result COMPATIBLE   SARS Coronavirus 2 [facility] order, Performed in [facility] hospital lab) Nasopharyngeal Nasopharyngeal Swab     Status: None   Collection Time: 03/19/19  4:48 AM   Specimen: Nasopharyngeal Swab  Result Value Ref Range   SARS Coronavirus 2 NEGATIVE NEGATIVE    Comment: (NOTE) If result is NEGATIVE SARS-CoV-2 target nucleic acids are NOT DETECTED. The SARS-CoV-2 RNA is generally detectable in upper and lower  respiratory specimens during the acute phase of infection. The lowest  concentration of SARS-CoV-2 viral copies this assay can detect is 250  copies / mL. A negative result does not preclude SARS-CoV-2 infection  and should not be used as the sole basis for treatment or other  patient management decisions.  A negative result may occur with  improper specimen collection / handling, submission of specimen other  than nasopharyngeal swab, presence of viral mutation(s) within  the  areas targeted by this assay, and inadequate number of viral copies  (<250 copies / mL). A negative result must be combined with clinical  observations, patient history, and epidemiological information. If result is POSITIVE SARS-CoV-2 target nucleic acids are DETECTED. The SARS-CoV-2 RNA is generally detectable in upper and lower  respiratory specimens dur ing the acute phase of infection.  Positive  results are indicative of active infection with SARS-CoV-2.  Clinical  correlation with patient history and other diagnostic information is  necessary to determine patient infection status.  Positive results do  not rule out bacterial infection or  co-infection with other viruses. If result is PRESUMPTIVE POSTIVE SARS-CoV-2 nucleic acids MAY BE PRESENT.   A presumptive positive result was obtained on the submitted specimen  and confirmed on repeat testing.  While 2019 novel coronavirus  (SARS-CoV-2) nucleic acids may be present in the submitted sample  additional confirmatory testing may be necessary for epidemiological  and / or clinical management purposes  to differentiate between  SARS-CoV-2 and other Sarbecovirus currently known to infect humans.  If clinically indicated additional testing with an alternate test  methodology [phone]([id number]) is advised. The SARS-CoV-2 RNA is generally  detectable in upper and lower respiratory sp ecimens during the acute  phase of infection. The expected result is Negative. Fact Sheet for Patients:  BoilerBrush.com.cyhttps://www.fda.gov/media/136312/download Fact Sheet for Healthcare Providers: https://pope.com/https://www.fda.gov/media/136313/download This test is not yet approved or cleared by the [place] FDA and has been authorized for detection and/or diagnosis of SARS-CoV-2 by FDA under an Emergency Use Authorization (EUA).  This EUA will remain in effect (meaning this test can be used) for the duration of the COVID-19 declaration under Section 564(b)(1) of the Act, 21 U.S.C. section 360bbb-3(b)(1), unless the authorization is terminated or revoked sooner. Performed at [facility], [address] [place], [place] [address]   Culture, beta strep (group b only)     Status: None   Collection Time: 03/19/19  5:02 AM   Specimen: Vaginal/Rectal; Genital  Result Value Ref Range   Specimen Description VAGINAL/RECTAL    Special Requests NONE    Culture      NO GROUP B STREP (S.AGALACTIAE) ISOLATED Performed at [facility], [address] [place], [place] [address]    Report Status 03/21/2019 FINAL     Physical exam:        General appearance/behavior: pleasant        Heart: mild tachycardia, S1S2        Lungs clear, equal bilaterally       Abdomen: soft, non-tender, gravid        Extremities: no edema, no evidence of DVT        Bimanual/ VE: Dilation: 2 Effacement (%): 70 Cervical Position: Mid position Station: -2 Presentation: Vertex Exam by:: Carlean JewsMeredith Sigmon, CNM  Fetal Assessment:  FHR: baseline 150s/moderate variability/ +15x15 accels/ no decels  TOCO: rare, occasional UI  Assessment: 35+[redacted] weeks gestation FHR category 1 Preterm labor with hx of PTD at 36 weeks with NICU admission - on weekly Makena - s/p BMZ x 2 doses  GBS negative Hypothyroid on Synthroid   Plan:  Discharge home today since stable cervical exam and rare contractions Continue Procardia 10mg  every 6 hrs x 1 week Plan to give herself Makena injection tomorrow  Preterm labor warning s/s reviewed FMC's daily Modified bed rest; no heavy lifting; pelvic rest F/u in office on Wednesday

## 2019-03-21 NOTE — Discharge Summary (Signed)
Patient ID: Penny Ponce MRN: [id number] DOB/AGE: 1990/10/09 28 y.o.  Admit date: 03/18/2019 Admission Diagnoses: 34+6 weeks preterm labor   Discharge date:  03/21/2019 Discharge Diagnoses: 35+1 weeks preterm labor stable   Prenatal history: G2P0101   EDC : 04/24/2019, Date entered prior to episode creation  Prenatal care at [facility] Primary provider : Carlean JewsMeredith Yaneth Fairbairn, CNM  Prenatal course complicated by  - Prior 36 wk delivery, NICU admission for RDS for 25 days.  - Serial cervical lengths normal CL 3 cm and weekly vaginal exams in 3rd trim have been closed cervix but pt repots head was low per CNM check yesterday.  - Hypothyroidism, on Levothyroxine 150mcg daily and additional 75mcg on Sundays Followed by [facility]ocr at [facility].  - Rh neg, Rhogam in pregnancy Prenatal Labs: ABO, Rh: --/--/B NEG (08/15 0348) Antibody: POS (08/15 0348) Rubella:   Immune RPR:   NR HBsAg:   Negative HIV:   NR GTT: passed GBS:   Negative on hospital admission  Ultrascreen neg AFP-1 neg  Medical / Surgical History :  Past medical history:  Past Medical History:  Diagnosis Date  . Hypothyroidism 2010    Past surgical history:  Past Surgical History:  Procedure Laterality Date  . NO PAST SURGERIES      Family History: No family history on file.  Social History:  reports that she has never smoked. She has never used smokeless tobacco. She reports that she does not drink alcohol or use drugs.  Allergies: Patient has no known allergies.   Current Medications at time of admission:  Prior to Admission medications   Medication Sig Start Date End Date Taking? Authorizing Provider  hydroxyprogesterone caproate (MAKENA) 250 mg/mL OIL injection Inject 250 mg into the muscle once.   Yes [provider]  levothyroxine (SYNTHROID, LEVOTHROID) 150 MCG tablet Take 1 tablet (150 mcg total) by mouth daily before breakfast. 11/26/15  Yes Sharee PimpleJones, Caron W, CNM  Prenatal Vit-Fe Fumarate-FA  (PRENATAL MULTIVITAMIN) TABS tablet Take 1 tablet by mouth daily at 12 noon.   Yes [provider]  Probiotic Product (PROBIOTIC-10 PO) Take 1 capsule by mouth.   Yes [provider]  cholecalciferol (VITAMIN D) 1000 UNITS tablet Take 1,000 Units by mouth daily.    [provider]  NIFEdipine (PROCARDIA) 10 MG capsule Take 1 capsule (10 mg total) by mouth every 6 (six) hours. 03/21/19   Karena AddisonSigmon, Prestyn Mahn C, [facility]    Hospital Course:  Pt. Admitted at 34+6 weeks with preterm labor and cervical changes from FTP to 2cm in MAU.  She received loading dose of Procardia 30mg , then oral Procardia every 6 hours and 2 doses of BMZ.  She developed maternal tachycardia on Saturday after several doses of Procardia, which improved by day of discharge.  GBS was collected and negative. She was kept on Sunday night due to increase in painful contractions at 11am then 4pm, but then resolved after dose of Procardia. She denied contractions overnight or this morning, except for an occasional Braxton-Hicks contraction.  Cervical exam was unchanged on 8/17 with rare contractions, so decision was made to d/c home on Procardia 10mg  every 6 hrs, Makena injection tomorrow, and PTL precautions. Plan for close f/u in office on Wednesday.   Discharge Instructions:  Discharged Condition: stable  Activity: pelvic rest, modified bed rest   Diet: routine  Medications: PNV Allergies as of 03/21/2019   No Known Allergies     Medication List    STOP taking these medications  cholecalciferol 1000 units tablet Commonly known as: VITAMIN D     TAKE these medications   hydroxyprogesterone caproate 250 mg/mL Oil injection Commonly known as: MAKENA Inject 250 mg into the muscle once.   levothyroxine 150 MCG tablet Commonly known as: SYNTHROID Take 1 tablet (150 mcg total) by mouth daily before breakfast.   NIFEdipine 10 MG capsule Commonly known as: PROCARDIA Take 1 capsule (10 mg total) by mouth  every 6 (six) hours.   prenatal multivitamin Tabs tablet Take 1 tablet by mouth daily at 12 noon.   PROBIOTIC-10 PO Take 1 capsule by mouth.       Discharge Instructions: Preterm/ labor signs reviewed                                            Fetal kick counts                                             Discharge to: Home  Follow up :  Follow-up Information    Darliss Cheney, CNM. Schedule an appointment as soon as possible for a visit in 2 day(s).   Specialty: Obstetrics and Gynecology Why: OB appointment  Contact information: [place] [phone]            Signed:

## 2019-03-21 NOTE — Progress Notes (Signed)
Pt discharged home accompanied by significant other. Pt educated on s/s of pre-term labor, medication regimen, home care, and appointment follow-up. Pt states understanding.   Horton Finer, RN

## 2019-03-23 LAB — TYPE AND SCREEN
ABO/RH(D): B NEG
Antibody Screen: POSITIVE
Unit division: 0
Unit division: 0

## 2019-03-23 LAB — BPAM RBC
Blood Product Expiration Date: 202008302359
Blood Product Expiration Date: 202008302359
Unit Type and Rh: 9500
Unit Type and Rh: 9500

## 2019-03-25 ENCOUNTER — Inpatient Hospital Stay (HOSPITAL_COMMUNITY)
Admission: AD | Admit: 2019-03-25 | Discharge: 2019-03-25 | Disposition: A | Discharge status: Home / Self Care | Payer: No Typology Code available for payment source | Attending: Obstetrics & Gynecology | Admitting: Obstetrics & Gynecology

## 2019-03-25 ENCOUNTER — Encounter (HOSPITAL_COMMUNITY): Payer: Self-pay | Admitting: *Deleted

## 2019-03-25 ENCOUNTER — Other Ambulatory Visit: Payer: Self-pay

## 2019-03-25 DIAGNOSIS — Z3A36 36 weeks gestation of pregnancy: Secondary | ICD-10-CM

## 2019-03-25 DIAGNOSIS — O99283 Endocrine, nutritional and metabolic diseases complicating pregnancy, third trimester: Secondary | ICD-10-CM | POA: Diagnosis not present

## 2019-03-25 DIAGNOSIS — O4703 False labor before 37 completed weeks of gestation, third trimester: Secondary | ICD-10-CM

## 2019-03-25 DIAGNOSIS — Z79890 Hormone replacement therapy (postmenopausal): Secondary | ICD-10-CM | POA: Insufficient documentation

## 2019-03-25 DIAGNOSIS — M549 Dorsalgia, unspecified: Secondary | ICD-10-CM | POA: Diagnosis present

## 2019-03-25 DIAGNOSIS — E039 Hypothyroidism, unspecified: Secondary | ICD-10-CM | POA: Diagnosis not present

## 2019-03-25 DIAGNOSIS — Z3A35 35 weeks gestation of pregnancy: Secondary | ICD-10-CM | POA: Diagnosis not present

## 2019-03-25 LAB — URINALYSIS, ROUTINE W REFLEX MICROSCOPIC
Bilirubin Urine: NEGATIVE
Glucose, UA: NEGATIVE mg/dL
Hgb urine dipstick: NEGATIVE
Ketones, ur: NEGATIVE mg/dL
Nitrite: NEGATIVE
Protein, ur: NEGATIVE mg/dL
Specific Gravity, Urine: 1.012 (ref 1.005–1.030)
pH: 7 (ref 5.0–8.0)

## 2019-03-25 NOTE — Discharge Instructions (Signed)
Preterm Labor and Birth Information °Pregnancy normally lasts 39-41 weeks. Preterm labor is when labor starts early. It starts before you have been pregnant for 37 whole weeks. °What are the risk factors for preterm labor? °Preterm labor is more likely to occur in women who: °· Have an infection while pregnant. °· Have a cervix that is short. °· Have gone into preterm labor before. °· Have had surgery on their cervix. °· Are younger than age 29. °· Are older than age 35. °· Are African American. °· Are pregnant with two or more babies. °· Take street drugs while pregnant. °· Smoke while pregnant. °· Do not gain enough weight while pregnant. °· Got pregnant right after another pregnancy. °What are the symptoms of preterm labor? °Symptoms of preterm labor include: °· Cramps. The cramps may feel like the cramps some women get during their period. The cramps may happen with watery poop (diarrhea). °· Pain in the belly (abdomen). °· Pain in the lower back. °· Regular contractions or tightening. It may feel like your belly is getting tighter. °· Pressure in the lower belly that seems to get stronger. °· More fluid (discharge) leaking from the vagina. The fluid may be watery or bloody. °· Water breaking. °Why is it important to notice signs of preterm labor? °Babies who are born early may not be fully developed. They have a higher chance for: °· Long-term heart problems. °· Long-term lung problems. °· Trouble controlling body systems, like breathing. °· Bleeding in the brain. °· A condition called cerebral palsy. °· Learning difficulties. °· Death. °These risks are highest for babies who are born before 34 weeks of pregnancy. °How is preterm labor treated? °Treatment depends on: °· How long you were pregnant. °· Your condition. °· The health of your baby. °Treatment may involve: °· Having a stitch (suture) placed in your cervix. When you give birth, your cervix opens so the baby can come out. The stitch keeps the cervix  from opening too soon. °· Staying at the hospital. °· Taking or getting medicines, such as: °? Hormone medicines. °? Medicines to stop contractions. °? Medicines to help the baby’s lungs develop. °? Medicines to prevent your baby from having cerebral palsy. °What should I do if I am in preterm labor? °If you think you are going into labor too soon, call your doctor right away. °How can I prevent preterm labor? °· Do not use any tobacco products. °? Examples of these are cigarettes, chewing tobacco, and e-cigarettes. °? If you need help quitting, ask your doctor. °· Do not use street drugs. °· Do not use any medicines unless you ask your doctor if they are safe for you. °· Talk with your doctor before taking any herbal supplements. °· Make sure you gain enough weight. °· Watch for infection. If you think you might have an infection, get it checked right away. °· If you have gone into preterm labor before, tell your doctor. °This information is not intended to replace advice given to you by your health care provider. Make sure you discuss any questions you have with your health care provider. °Document Released: 10/17/2008 Document Revised: 11/12/2018 Document Reviewed: 12/12/2015 °Elsevier Patient Education © 2020 Elsevier Inc. ° °

## 2019-03-25 NOTE — MAU Note (Signed)
About 1155, started having some mild contractions.  Just got out Monday.  Took Procardia at 1200, laid down.  Seemed to be feeling better.  When she got up, she would be fell pressure.  Got up at 3, started feeling the ctx's and pressure again.  Called office, told to come be seen.  abd feels tight, not like getting full relaxation, feeling pulling in low back.

## 2019-03-25 NOTE — MAU Provider Note (Signed)
History     CSN: [id number]  Arrival date and time: 03/25/19 1710   None     Chief Complaint  Patient presents with  . Contractions  . pelvic pressure  . Back Pain   Penny Ponce is a 28 yo G2P0101 at 335.5 EGA who is presenting to MAU complaining of irregular contractions since about 11:55 this morning. She says she is still working, and normally sits at a desk. Every time she would get up from her desk to use the bathroom or run a small errand, her contractions would start. She denies leakage of fluid, vaginal discharge, or vaginal bleeding.  She was recently admitted to [facility] at 34.6 EGA preterm labor and cervical changes from fingertip to 2 cm in MAU, she was discharged on Monday.  She received loading dose of Procardia 30mg , then oral Procardia every 6 hours and 2 doses of BMZ.  She developed maternal tachycardia on Saturday after several doses of Procardia, which improved by day of discharge.  GBS was collected and negative. She was kept on Sunday night due to increase in painful contractions at 11am then 4pm, but then resolved after dose of Procardia. Cervical exam was unchanged on 8/17 with rare contractions, so decision was made to d/c home on Procardia 10mg  every 6 hrs. She received her last Makena injection on Monday, and was seen in the office on Wednesday where her exam was 2/70/-2.   OB History    Gravida  2   Para  1   Term  0   Preterm  1   AB  0   Living  1     SAB  0   TAB  0   Ectopic  0   Multiple  0   Live Births  1           Past Medical History:  Diagnosis Date  . Hypothyroidism 2010    Past Surgical History:  Procedure Laterality Date  . NO PAST SURGERIES      No family history on file.  Social History   Tobacco Use  . Smoking status: Never Smoker  . Smokeless tobacco: Never Used  Substance Use Topics  . Alcohol use: No  . Drug use: No    Allergies: No Known Allergies  Medications Prior to Admission  Medication Sig  Dispense Refill Last Dose  . hydroxyprogesterone caproate (MAKENA) 250 mg/mL OIL injection Inject 250 mg into the muscle once.   Past Week at Unknown time  . levothyroxine (SYNTHROID, LEVOTHROID) 150 MCG tablet Take 1 tablet (150 mcg total) by mouth daily before breakfast. 30 tablet 3 03/25/2019 at Unknown time  . NIFEdipine (PROCARDIA) 10 MG capsule Take 1 capsule (10 mg total) by mouth every 6 (six) hours. 60 capsule 0 03/25/2019 at Unknown time  . Prenatal Vit-Fe Fumarate-FA (PRENATAL MULTIVITAMIN) TABS tablet Take 1 tablet by mouth daily at 12 noon.   03/25/2019 at Unknown time  . Probiotic Product (PROBIOTIC-10 PO) Take 1 capsule by mouth.   03/25/2019 at Unknown time    Review of Systems  Constitutional: Negative.   HENT: Negative.   Eyes: Negative.   Respiratory: Negative.   Cardiovascular: Negative.   Gastrointestinal: Negative.   Endocrine: Negative.   Genitourinary: Negative.   Musculoskeletal: Negative.   Skin: Negative.   Allergic/Immunologic: Negative.   Neurological: Negative.   Hematological: Negative.   Psychiatric/Behavioral: Negative.    Physical Exam   Blood pressure 123/72, pulse (!) 108, temperature 98 F (36.7  C), temperature source Oral, resp. rate 18, weight 83.2 kg, SpO2 99 %, unknown if currently breastfeeding.  Physical Exam  Constitutional: She appears well-developed and well-nourished.  HENT:  Head: Normocephalic and atraumatic.  Eyes: Pupils are equal, round, and reactive to light. Conjunctivae and EOM are normal.  Neck: Normal range of motion. Neck supple.  Cardiovascular: Normal rate, regular rhythm and normal heart sounds.  Respiratory: Effort normal and breath sounds normal.  GI:  Gravid  Genitourinary:    Genitourinary Comments: SVE: 2/70/-2     MAU Course  Procedures  MDM Case discussed with Dr. Benjie Karvonen given recent admission for preterm labor -plan to observe for an hour and see if she has made any cervical change -ok to DC home if  contractions decrease and no cervical change is made  Assessment and Plan  28 year old G2P0101 at 57.5 EGA presenting for irregular contractions with history of pre-term delivery and recent admission for pre-term labor -s/p BMZ -GBS neg -recheck SVE (7:14 PM): 2/70/-1, unchanged -DC to home -Pre-term labor/return precautions discussed  Shantae Vantol L Emya Picado 03/25/2019, 6:17 PM

## 2019-04-01 ENCOUNTER — Other Ambulatory Visit: Payer: Self-pay

## 2019-04-01 ENCOUNTER — Inpatient Hospital Stay (HOSPITAL_COMMUNITY)
Admission: AD | Admit: 2019-04-01 | Discharge: 2019-04-04 | DRG: 806 | Disposition: A | Discharge status: Home / Self Care | Payer: No Typology Code available for payment source

## 2019-04-01 ENCOUNTER — Encounter (HOSPITAL_COMMUNITY): Payer: Self-pay

## 2019-04-01 DIAGNOSIS — O99284 Endocrine, nutritional and metabolic diseases complicating childbirth: Secondary | ICD-10-CM | POA: Diagnosis present

## 2019-04-01 DIAGNOSIS — Z3490 Encounter for supervision of normal pregnancy, unspecified, unspecified trimester: Secondary | ICD-10-CM

## 2019-04-01 DIAGNOSIS — Z3A36 36 weeks gestation of pregnancy: Secondary | ICD-10-CM

## 2019-04-01 DIAGNOSIS — O42919 Preterm premature rupture of membranes, unspecified as to length of time between rupture and onset of labor, unspecified trimester: Secondary | ICD-10-CM | POA: Diagnosis present

## 2019-04-01 DIAGNOSIS — E039 Hypothyroidism, unspecified: Secondary | ICD-10-CM | POA: Diagnosis present

## 2019-04-01 DIAGNOSIS — D62 Acute posthemorrhagic anemia: Secondary | ICD-10-CM | POA: Diagnosis not present

## 2019-04-01 DIAGNOSIS — O26893 Other specified pregnancy related conditions, third trimester: Secondary | ICD-10-CM | POA: Diagnosis present

## 2019-04-01 DIAGNOSIS — Z6791 Unspecified blood type, Rh negative: Secondary | ICD-10-CM | POA: Diagnosis not present

## 2019-04-01 DIAGNOSIS — O42913 Preterm premature rupture of membranes, unspecified as to length of time between rupture and onset of labor, third trimester: Secondary | ICD-10-CM | POA: Diagnosis present

## 2019-04-01 DIAGNOSIS — Z20828 Contact with and (suspected) exposure to other viral communicable diseases: Secondary | ICD-10-CM | POA: Diagnosis present

## 2019-04-01 DIAGNOSIS — O9081 Anemia of the puerperium: Secondary | ICD-10-CM | POA: Diagnosis not present

## 2019-04-01 LAB — SARS CORONAVIRUS 2 BY RT PCR (HOSPITAL ORDER, PERFORMED IN ~~LOC~~ HOSPITAL LAB): SARS Coronavirus 2: NEGATIVE

## 2019-04-01 MED ORDER — LIDOCAINE HCL (PF) 1 % IJ SOLN
30.0000 mL | INTRAMUSCULAR | Status: DC | PRN
  Filled 2019-04-01: qty 30

## 2019-04-01 MED ORDER — SOD CITRATE-CITRIC ACID 500-334 MG/5ML PO SOLN: 30.0000 mL | ORAL | Status: DC | PRN

## 2019-04-01 MED ORDER — OXYCODONE-ACETAMINOPHEN 5-325 MG PO TABS: 1.0000 | ORAL_TABLET | ORAL | Status: DC | PRN

## 2019-04-01 MED ORDER — OXYTOCIN BOLUS FROM INFUSION: 500.0000 mL | Freq: Once | INTRAVENOUS | Status: DC

## 2019-04-01 MED ORDER — FLEET ENEMA 7-19 GM/118ML RE ENEM: 1.0000 | ENEMA | Freq: Once | RECTAL | Status: DC

## 2019-04-01 MED ORDER — OXYTOCIN 40 UNITS IN NORMAL SALINE INFUSION - SIMPLE MED: 2.5000 [IU]/h | INTRAVENOUS | Status: DC

## 2019-04-01 MED ORDER — ZOLPIDEM TARTRATE 5 MG PO TABS: 5.0000 mg | ORAL_TABLET | Freq: Every evening | ORAL | Status: DC | PRN

## 2019-04-01 MED ORDER — ONDANSETRON HCL 4 MG/2ML IJ SOLN: 4.0000 mg | Freq: Four times a day (QID) | INTRAMUSCULAR | Status: DC | PRN

## 2019-04-01 MED ORDER — LACTATED RINGERS IV SOLN: 500.0000 mL | INTRAVENOUS | Status: DC | PRN

## 2019-04-01 MED ORDER — LACTATED RINGERS IV SOLN: INTRAVENOUS | Status: DC

## 2019-04-01 MED ORDER — OXYCODONE-ACETAMINOPHEN 5-325 MG PO TABS: 2.0000 | ORAL_TABLET | ORAL | Status: DC | PRN

## 2019-04-01 MED ORDER — ACETAMINOPHEN 325 MG PO TABS: 650.0000 mg | ORAL_TABLET | ORAL | Status: DC | PRN

## 2019-04-01 NOTE — H&P (Addendum)
OB ADMISSION/ HISTORY & PHYSICAL:  Admission Date: 04/01/2019  8:36 PM  Admit Diagnosis: water broke    Penny ConceptionLacey B Nordgren is a 28 y.o. female presenting for PPROM. Reports clear leakage of vaginal fluid that began around 1917 today. Reports that amount of fluid has continually increased. Intermittent mild ctx, no changes in ctx reported from past few days. Denies vaginal bleeding, epigastric pain, nausea, dizziness, decreased fetal movement. Spouse present and supportive.    Prenatal History: G2P0101   EDC : 04/24/2019,  [facility] since 10+1 wk gest CNM patient, desires natural labor, low intervention  Prenatal course complicated by: Hypothyroidism well controlled       - Levothyroxine 150mcg daily and additional 75mcg on Sundays. [facility]       - Makena injections weekly at home since 16 weeks up until 03/22/19.       - S/P BMZ inj.  x 2 at 35 wks and procardia prn for preterm ctx at 35 WKS Rh neg       - Rhogam given  01/23/2019  Prenatal Labs: ABO, Rh:   Neg Antibody: POS (08/15 0348) (s/p Rhogam at 28 wks) Rubella: Immune (02/24 0000)  RPR: Nonreactive (02/24 0000)  HBsAg: Negative (02/24 0000)  HIV: Non-reactive (02/24 0000)  GBS:   Neg 1 hr Glucola : 97 Genetic Screening: NT/Ultrascreen and AFP1 normal Ultrasound: normal XX anatomy, anterior placenta  Medical / Surgical History :  Past medical history:  Past Medical History:  Diagnosis Date  . Hypothyroidism 2010     Past surgical history:  Past Surgical History:  Procedure Laterality Date  . NO PAST SURGERIES       Family History: No family history on file.   Social History:  reports that she has never smoked. She has never used smokeless tobacco. She reports that she does not drink alcohol or use drugs.   Allergies: Patient has no known allergies.   Current Medications at time of admission:  Medications Prior to  Admission  Medication Sig Dispense Refill Last Dose  . levothyroxine (SYNTHROID, LEVOTHROID) 150 MCG tablet Take 1 tablet (150 mcg total) by mouth daily before breakfast. 30 tablet 3 04/01/2019 at Unknown time  . Prenatal Vit-Fe Fumarate-FA (PRENATAL MULTIVITAMIN) TABS tablet Take 1 tablet by mouth daily at 12 noon.   04/01/2019 at Unknown time  . Probiotic Product (PROBIOTIC-10 PO) Take 1 capsule by mouth.   04/01/2019 at Unknown time  . hydroxyprogesterone caproate (MAKENA) 250 mg/mL OIL injection Inject 250 mg into the muscle once.   03/22/2019  . NIFEdipine (PROCARDIA) 10 MG capsule Take 1 capsule (10 mg total) by mouth every 6 (six) hours. 60 capsule 0 03/27/2019     Review of Systems: ROS  As noted in HPI  Physical Exam: Vital signs and nursing notes reviewed.  ED Triage Vitals  Enc Vitals Group     BP 04/01/19 2108 125/76     Pulse Rate 04/01/19 2108 (!) 107     Resp 04/01/19 2108 18     Temp 04/01/19 2108 98.3 F (36.8 C)     Temp Source 04/01/19 2108 Oral     SpO2 --      Weight 04/01/19 2118 149 lb 14.4 oz (68 kg)     Height 04/01/19 2118 5\' 5"  (1.651 m)     Head Circumference --      Peak Flow --  Pain Score 04/01/19 2108 1     Pain Loc --      Pain Edu? --      Excl. in [place]? --      General: AAO x 3, NAD, coping well. Heart: RRR Lungs:CTAB Abdomen: Gravid, NT, Leopold's vertex. Extremities: No edema Genitalia / VE:  SVE deferred at this time Gross LOF noted on pad, clear  BS sono - vertex, anterior placenta, AFI subjectively normal, + FHM  FHR: 150 BPM, moderate variability, + accels,  One variable decel, not repetitive TOCO: Ctx occasional, mild  Labs:   Pending T&S, CBC, RPR    Assessment:  28 y.o. G2P0101 at [redacted]w[redacted]d PPROM clear AF          1. Not in labor 2. FHR category 1 3. GBS neg 4. Desires natural labor 5. Breastfeeding 6. Plans for placenta to be discussed  Plan:  1. Admit to BS 2. Routine L&D orders 3. Analgesia/anesthesia PRN  4.  Ambien PRN for sleep 5. Intermittent EFM with Category 1 FHT 6. Pt planning for natural labor 7. Expectant management, discussed augmentation if active labor not ensued in next few hours  Dr Murrell Redden notified of admission / plan of care   Juanna Cao BSN, SNM 04/01/2019, 9:58 PM   Medical screening examination/treatment/procedure(s) were conducted as a shared visit with non-physician practitioner(s) and myself.  I personally evaluated the patient during the encounter.   Juliene Pina, CNM, MSN 04/01/2019, 11:41 PM

## 2019-04-02 ENCOUNTER — Encounter (HOSPITAL_COMMUNITY): Payer: Self-pay

## 2019-04-02 LAB — CBC
HCT: 36.2 % (ref 36.0–46.0)
Hemoglobin: 11.7 g/dL — ABNORMAL LOW (ref 12.0–15.0)
MCH: 27.7 pg (ref 26.0–34.0)
MCHC: 32.3 g/dL (ref 30.0–36.0)
MCV: 85.8 fL (ref 80.0–100.0)
Platelets: 223 10*3/uL (ref 150–400)
RBC: 4.22 MIL/uL (ref 3.87–5.11)
RDW: 14.1 % (ref 11.5–15.5)
WBC: 8.8 10*3/uL (ref 4.0–10.5)
nRBC: 0 % (ref 0.0–0.2)

## 2019-04-02 LAB — RPR: RPR Ser Ql: NONREACTIVE

## 2019-04-02 MED ORDER — WITCH HAZEL-GLYCERIN EX PADS: 1.0000 "application " | MEDICATED_PAD | CUTANEOUS | Status: DC | PRN

## 2019-04-02 MED ORDER — BISACODYL 10 MG RE SUPP: 10.0000 mg | Freq: Every day | RECTAL | Status: DC | PRN

## 2019-04-02 MED ORDER — COCONUT OIL OIL: 1.0000 "application " | TOPICAL_OIL | Status: DC | PRN

## 2019-04-02 MED ORDER — LEVOTHYROXINE SODIUM 75 MCG PO TABS
150.0000 ug | ORAL_TABLET | Freq: Every day | ORAL | Status: DC
  Administered 2019-04-03: 150 ug via ORAL
  Filled 2019-04-02: qty 2

## 2019-04-02 MED ORDER — SIMETHICONE 80 MG PO CHEW: 80.0000 mg | CHEWABLE_TABLET | ORAL | Status: DC | PRN

## 2019-04-02 MED ORDER — LEVOTHYROXINE SODIUM 75 MCG PO TABS
75.0000 ug | ORAL_TABLET | Freq: Once | ORAL | Status: AC
  Administered 2019-04-03: 75 ug via ORAL
  Filled 2019-04-02: qty 1

## 2019-04-02 MED ORDER — FLEET ENEMA 7-19 GM/118ML RE ENEM: 1.0000 | ENEMA | Freq: Every day | RECTAL | Status: DC | PRN

## 2019-04-02 MED ORDER — IBUPROFEN 600 MG PO TABS
600.0000 mg | ORAL_TABLET | Freq: Four times a day (QID) | ORAL | Status: DC
  Filled 2019-04-02: qty 1

## 2019-04-02 MED ORDER — DIPHENHYDRAMINE HCL 25 MG PO CAPS: 25.0000 mg | ORAL_CAPSULE | Freq: Four times a day (QID) | ORAL | Status: DC | PRN

## 2019-04-02 MED ORDER — BENZOCAINE-MENTHOL 20-0.5 % EX AERO: 1.0000 "application " | INHALATION_SPRAY | CUTANEOUS | Status: DC | PRN

## 2019-04-02 MED ORDER — ONDANSETRON HCL 4 MG PO TABS: 4.0000 mg | ORAL_TABLET | ORAL | Status: DC | PRN

## 2019-04-02 MED ORDER — ONDANSETRON HCL 4 MG/2ML IJ SOLN: 4.0000 mg | INTRAMUSCULAR | Status: DC | PRN

## 2019-04-02 MED ORDER — ACETAMINOPHEN 325 MG PO TABS: 650.0000 mg | ORAL_TABLET | ORAL | Status: DC | PRN

## 2019-04-02 MED ORDER — SENNOSIDES-DOCUSATE SODIUM 8.6-50 MG PO TABS: 2.0000 | ORAL_TABLET | ORAL | Status: DC

## 2019-04-02 MED ORDER — OXYTOCIN 10 UNIT/ML IJ SOLN
INTRAMUSCULAR | Status: AC
  Filled 2019-04-02: qty 1

## 2019-04-02 MED ORDER — PRENATAL MULTIVITAMIN CH: 1.0000 | ORAL_TABLET | Freq: Every day | ORAL | Status: DC

## 2019-04-02 MED ORDER — OXYTOCIN 10 UNIT/ML IJ SOLN
10.0000 [IU] | Freq: Once | INTRAMUSCULAR | Status: AC
  Administered 2019-04-02: 10:00:00 10 [IU] via INTRAMUSCULAR

## 2019-04-02 MED ORDER — DIBUCAINE (PERIANAL) 1 % EX OINT: 1.0000 "application " | TOPICAL_OINTMENT | CUTANEOUS | Status: DC | PRN

## 2019-04-02 NOTE — Progress Notes (Signed)
Patient sleeping. No NST obtained on 04/02/19 from 0200-0300 per CNM order.Will obtain FHR/NST upon patient waking.

## 2019-04-02 NOTE — Progress Notes (Signed)
S: Doing well. Started feeling regular ctx since 0530. + back pain and bloody show. Has not slept. Spouse supportive at bedside.  O: Vitals:   04/01/19 2227 04/02/19 0123 04/02/19 0345 04/02/19 0552  BP: 117/69 117/80 120/65 131/80  Pulse: 98 99 (!) 102 (!) 110  Resp: 17 17 17 18   Temp:    98.2 F (36.8 C)  TempSrc:    Oral  Weight:      Height:         FHT:  FHR: 125 bpm, variability: moderate,  accelerations:  Present,  decelerations:  Present very mild variables with ctx occ UC:   regular, every 5 minutes per palpation by RN SVE:   Dilation: 5 Effacement (%): 60 Station: -2 Exam by:: B Jumana Paccione CNMS Vertex Clear AF   A / P: 28 y.o. G2P0101at [redacted]w[redacted]d PROM x 12 hours approx Spontaneous labor, progressing normally Physiologic labor apparent Continues to desire natural labor Coping well with pain Spouse supportive Continue expectant management Augmentation for latency PRN  Fetal Wellbeing:  Category I Pain Control:  Labor support without medications  Anticipated MOD:  NSVD   Juanna Cao, CNM, MSN 04/02/2019, 6:47 AM

## 2019-04-02 NOTE — Progress Notes (Signed)
NST performed from 0343 to [phone]

## 2019-04-02 NOTE — Lactation Note (Addendum)
This note was copied from a baby's chart. Lactation Consultation Note  Patient Name: Penny Ponce [id number] Date: 04/02/2019 Reason for consult: Initial assessment;Late-preterm 34-36.6wks P2, 14 hour female LPTI. Infant had 2 stools and one void diaper. Per mom, she feels breastfeeding is going well infant has been breastfeeding 15 minutes most feedings. Infant latched 8 times since birth. Per mom, she attempted to breastfeed her 29 year old son but he did not latch well due being in NICU for 25 days, mom pumped and gave her son EBM until he was 24 months of age. Mom has two DEBP's  at home. LC did not observe latch at this time,  infant had breastfed for 15 minutes,  less than 2 hours prior to [facility] entering the room. Mom taught back hand expression and infant was given 5 ml of EBM by curve tip syringe.  Parents have been doing STS with infant. Mom knows to breastfeed infant 64 to 12 times within 24 hours and with feeding cues and mom knows not to make baby wait to breastfeed. Mom knows not to breastfeeding infant longer than 30 minutes at a time. Mom understands due infant size and being LPTI that supplement may be need after breastfeeding infant. Mom was given LPTI policy sheet and understands that at 0-24 hours after birth infant may need be supplemented EBM/ and or formula   5-10 ml  with every feeding. Mom wants to supplement with her EBM only  instead of using formula at this time.  Mom will hand express or use hand pump due thickness of colostrum and give infant back volume after infant feeds at breast. Mom will use DEBP every 3 hours for 15 minutes on initial setting.  Mom knows to call Nurse or [place] if she has any questions, concerns or need assistance with latching infant to breast.     Maternal Data Formula Feeding for Exclusion: No Has patient been taught Hand Expression?: Yes(Mom expressed 79m l of colostrum that was given by curve tip syringe.) Does the patient have breastfeeding  experience prior to this delivery?: Yes  Feeding Feeding Type: Breast Fed  LATCH Score                   Interventions Interventions: Breast feeding basics reviewed;Skin to skin;Expressed milk;DEBP  Lactation Tools Discussed/Used Tools: Pump Breast pump type: Double-Electric Breast Pump;Other (comment)(curve tip syringe) WIC Program: No Pump Review: Setup, frequency, and cleaning;Milk Storage Initiated by:: Vicente Serene, IBCLC Date initiated:: 04/02/19   Consult Status Consult Status: Follow-up Date: 04/03/19 Follow-up type: In-patient    Vicente Serene 04/02/2019, 11:57 PM

## 2019-04-03 LAB — CBC
HCT: 32.3 % — ABNORMAL LOW (ref 36.0–46.0)
Hemoglobin: 10.5 g/dL — ABNORMAL LOW (ref 12.0–15.0)
MCH: 27.3 pg (ref 26.0–34.0)
MCHC: 32.5 g/dL (ref 30.0–36.0)
MCV: 84.1 fL (ref 80.0–100.0)
Platelets: 200 10*3/uL (ref 150–400)
RBC: 3.84 MIL/uL — ABNORMAL LOW (ref 3.87–5.11)
RDW: 14.2 % (ref 11.5–15.5)
WBC: 10.9 10*3/uL — ABNORMAL HIGH (ref 4.0–10.5)
nRBC: 0 % (ref 0.0–0.2)

## 2019-04-03 MED ORDER — RHO D IMMUNE GLOBULIN 1500 UNIT/2ML IJ SOSY
300.0000 ug | PREFILLED_SYRINGE | Freq: Once | INTRAMUSCULAR | Status: AC
  Administered 2019-04-03: 300 ug via INTRAMUSCULAR
  Filled 2019-04-03: qty 2

## 2019-04-03 NOTE — Lactation Note (Addendum)
This note was copied from a baby's chart. Lactation Consultation Note  Patient Name: Penny Ponce [id number] Date: 04/03/2019   Infant is 18 hrs old. Mom was observed pumping near the end of a pumping cycle. She has been using size 24 flanges, but needs size 27 flanges. Parents understand to use the size 27 flanges next time. She just pumped 6 mL, which is the most she has obtained so far.   Mom says infant has been bottle feeding well with the extra-slow flow nipples with infant in the side-lying position (Mom's 1st baby was in the NICU for 10 days and also born at 45 weeks). Parents have guidelines for volume parameters based on day of life, but know to feed until infant is content.   Mom is being treated for hypothyroidism. She had an abundant supply with her 1st child. She lactated for 9-10 months, but was able to provide her 1st child with EBM for 13 months.  Parents with no other questions at this time.   Matthias Hughs [facility] 04/03/2019, 4:20 PM

## 2019-04-03 NOTE — Progress Notes (Signed)
No c/o; pain controlled, tol po; voiding w/o difficulty Ambulating Nursing  Temp:  [97.6 F (36.4 C)-98.3 F (36.8 C)] 98.2 F (36.8 C) (08/30 0549) Pulse Rate:  [81-96] 81 (08/30 0549) Resp:  [18] 18 (08/30 0549) BP: (103-125)/(76-81) 122/77 (08/30 0549) SpO2:  [98 %-99 %] 98 % (08/30 0549)  A&ox3 rrr ctab Abd: soft, nt, nd; fundus firm and 1-2 cm below umb LE: no edema, nt bilat  CBC Latest Ref Rng & Units 04/03/2019 04/02/2019 11/25/2015  WBC 4.0 - 10.5 K/uL 10.9(H) 8.8 16.6(H)  Hemoglobin 12.0 - 15.0 g/dL 10.5(L) 11.7(L) 10.8(L)  Hematocrit 36.0 - 46.0 % 32.3(L) 36.2 33.0(L)  Platelets 150 - 400 K/uL 200 223 193   A/P: ppd 1 s/p svd, late preterm 1. Doing well, plan d/c home in am 2. Chronic anemia with acute change - iron q day pp; asymptomatic

## 2019-04-04 LAB — RH IG WORKUP (INCLUDES ABO/RH)
ABO/RH(D): B NEG
Fetal Screen: NEGATIVE
Gestational Age(Wks): 36.8
Unit division: 0

## 2019-04-04 MED ORDER — IBUPROFEN 600 MG PO TABS: 600.0000 mg | ORAL_TABLET | Freq: Four times a day (QID) | ORAL | 0 refills | Status: AC

## 2019-04-04 NOTE — Progress Notes (Signed)
PPD #2, SVD, intact perineum, baby girl "[name]"  S:  Reports feeling good, no concerns; ready to be discharged home today              Tolerating po/ No nausea or vomiting / Denies dizziness or SOB             Bleeding is light             Pain controlled with Motrin and Tylenol             Up ad lib / ambulatory / voiding QS  Newborn breast feeding - going well   O:               VS: BP 116/76 (BP Location: Right Arm)   Pulse 84   Temp 98.1 F (36.7 C) (Oral)   Resp 18   Ht 5\' 5"  (1.651 m)   Wt 68 kg   SpO2 98%   Breastfeeding Unknown   BMI 24.94 kg/m    LABS:              Recent Labs    04/02/19 0113 04/03/19 0454  WBC 8.8 10.9*  HGB 11.7* 10.5*  PLT 223 200               Blood type: --/--/B NEG (08/30 0454)  Rubella: Immune (02/24 0000)                     I&O: Intake/Output      08/30 0701 - 08/31 0700 08/31 0701 - 09/01 0700   Blood     Total Output     Net                        Physical Exam:             Alert and oriented X3  Lungs: Clear and unlabored  Heart: regular rate and rhythm / no murmurs  Abdomen: soft, non-tender, non-distended              Fundus: firm, non-tender, U-2  Perineum: intact, no edema, no erythema, no ecchymosis  Lochia: scant, no clots   Extremities: no edema, no calf pain or tenderness    A: PPD # 2, SVD, PTB  ABL Anemia - stable, asymptomatic   RH Negative - baby RH positive - s/p Rhogam   Hypothyroidism - on Synthroid   Doing well - stable status  P: Routine post partum orders  Discharge home today  WOB discharge book given, instructions and warning s/s reviewed   Contact endocrinologist for postpartum lab work  F/u for PP visit in 6 weeks;   Lars Pinks, MSN, CNM [facility]

## 2019-04-04 NOTE — Lactation Note (Signed)
This note was copied from a baby's chart. Lactation Consultation Note  Patient Name: Penny Ponce [id number] Date: 04/04/2019   Baby 38 hours [redacted]w[redacted]d and mother stopped pumping with #27 flanges as LC walked into room.  Mother has been pumping 20 ml+. Praised her for her efforts.  Mother has personal DEBP at home and plans to bf and continue to supplement after with her breastmilk and formula.  Feed on demand approximately 8-12 times per day at least q 3 hours. Reviewed engorgement care and monitoring voids/stools.      Maternal Data    Feeding Feeding Type: Breast Milk with Formula added  LATCH Score                   Interventions    Lactation Tools Discussed/Used     Consult Status      Carlye Grippe 04/04/2019, 9:25 AM

## 2019-04-04 NOTE — Discharge Summary (Signed)
Obstetric Discharge Summary   Patient Name: Penny Ponce DOB: Aug 21, 1990 MRN: [id number]  Date of Admission: 04/01/2019 Date of Discharge: 04/04/2019 Date of Delivery: 04/02/2019 Gestational Age at Delivery: [redacted]w[redacted]d  Primary OB: [facility] - CNM Management/ M. Sigmon, CNM   Antepartum complications:  Hypothyroidism well controlled       - Levothyroxine 174mcg daily and additional 43mcg on Sundays. Being followed by Endocrine at [facility] Hx PTD at 36 wks       - Makena injections weekly at home since 16 weeks up until 03/22/19.       - S/P BMZ inj.  x 2 at 35 wks and procardia prn for preterm ctx at 35 WKS Rh neg       - Rhogam given  01/23/2019 Prenatal Labs:  ABO, Rh:   Neg Antibody: POS (08/15 0348) (s/p Rhogam at 28 wks) Rubella: Immune (02/24 0000)  RPR: Nonreactive (02/24 0000)  HBsAg: Negative (02/24 0000)  HIV: Non-reactive (02/24 0000)  GBS:   Neg 1 hr Glucola : 97 Genetic Screening: NT/Ultrascreen and AFP1 normal Ultrasound: normal XX anatomy, anterior placenta  Admitting Diagnosis: 36+6 weeks SROM, labor   Secondary Diagnoses: Patient Active Problem List   Diagnosis Date Noted  . SVD (spontaneous vaginal delivery) 04/02/2019  . Preterm premature rupture of membranes 04/01/2019  . Hypothyroidism 04/01/2019  . Preterm uterine contractions in third trimester, antepartum 03/19/2019  . Postpartum care following vaginal delivery 8/29 11/24/2015  . Abdominal pain affecting pregnancy 09/19/2015  . Family history of cleft lip and palate 06/11/2015  . First trimester screening 06/11/2015    Augmentation: None Complications: None  Date of Delivery: 04/02/2019 Delivered By: Derrell Lolling, CNM  Delivery Type: spontaneous vaginal delivery Anesthesia: none Placenta: spontaneous Laceration: none Episiotomy: none  Newborn Data: Live born female  Birth Weight: 6 lb 7.9 oz (2946 g) APGAR: 8, 9  Newborn Delivery   Birth date/time: 04/02/2019 09:35:00 Delivery  type: Vaginal, Spontaneous         Hospital/Postpartum Course  (Vaginal Delivery): Pt. Admitted at 36+6 weeks with SROM.  She received at 34+6/35 weeks, and weekly makena from 16-36 weeks.  She delivered by NSVD.  See notes and delivery summary for details. Patient had an uncomplicated postpartum course.  By time of discharge on PPD#2, her pain was controlled on oral pain medications; she had appropriate lochia and was ambulating, voiding without difficulty and tolerating regular diet.  She was deemed stable for discharge to home.    Labs: CBC Latest Ref Rng & Units 04/03/2019 04/02/2019 11/25/2015  WBC 4.0 - 10.5 K/uL 10.9(H) 8.8 16.6(H)  Hemoglobin 12.0 - 15.0 g/dL 10.5(L) 11.7(L) 10.8(L)  Hematocrit 36.0 - 46.0 % 32.3(L) 36.2 33.0(L)  Platelets 150 - 400 K/uL 200 223 193   B NEG  Physical exam:  BP 116/76 (BP Location: Right Arm)   Pulse 84   Temp 98.1 F (36.7 C) (Oral)   Resp 18   Ht 5\' 5"  (1.651 m)   Wt 68 kg   SpO2 98%   Breastfeeding Unknown   BMI 24.94 kg/m  General: alert and no distress Pulm: normal respiratory effort Lochia: appropriate Abdomen: soft, NT Uterine Fundus: firm, below umbilicus Perineum: healing well, no significant erythema, no significant edema Extremities: No evidence of DVT seen on physical exam. No lower extremity edema.   Disposition: stable, discharge to home Baby Feeding: breast milk Baby Disposition: home with mom  Rh Immune globulin given: given 04/03/2019 Rubella vaccine given: N/A Tdap vaccine given  in AP or PP setting:  UTD Flu vaccine given in AP or PP setting: not on file   Plan:  Penny Ponce was discharged to home in good condition. [facility]  Discharge Instructions: Per After Visit Summary. Refer to After Visit Summary and [facility] discharge book[facility]  Activity: Advance as tolerated. Pelvic rest for 6 weeks.   Diet: Regular, Heart Healthy Discharge Medications: Allergies  as of 04/04/2019   No Known Allergies     Medication List    STOP taking these medications   hydroxyprogesterone caproate 250 mg/mL Oil injection Commonly known as: MAKENA   NIFEdipine 10 MG capsule Commonly known as: PROCARDIA     TAKE these medications   ibuprofen 600 MG tablet Commonly known as: ADVIL Take 1 tablet (600 mg total) by mouth every 6 (six) hours.   levothyroxine 150 MCG tablet Commonly known as: SYNTHROID Take 1 tablet (150 mcg total) by mouth daily before breakfast.   prenatal multivitamin Tabs tablet Take 1 tablet by mouth daily at 12 noon.   PROBIOTIC-10 PO Take 1 capsule by mouth.      Outpatient follow up:  Follow-up Information    Karena AddisonSigmon, Meredith C, CNM. Schedule an appointment as soon as possible for a visit in 6 week(s).   Specialty: Obstetrics and Gynecology Why: Postpartum visit Contact information: [address] [place] [id number] [phone]           Signed:  Carlean JewsMeredith Sigmon, MSN, CNM [facility]

## 2019-04-05 LAB — TYPE AND SCREEN
ABO/RH(D): B NEG
Antibody Screen: POSITIVE
Unit division: 0
Unit division: 0

## 2019-04-05 LAB — BPAM RBC
Blood Product Expiration Date: 202009222359
Blood Product Expiration Date: 202009222359
Unit Type and Rh: 1700
Unit Type and Rh: 1700

## 2019-04-24 ENCOUNTER — Inpatient Hospital Stay (HOSPITAL_COMMUNITY): Admission: AD | Admit: 2019-04-24 | Payer: Self-pay | Source: Home / Self Care

## 2022-02-26 ENCOUNTER — Emergency Department (HOSPITAL_COMMUNITY)
Admission: EM | Admit: 2022-02-26 | Discharge: 2022-02-26 | Disposition: A | Discharge status: Home / Self Care | Payer: No Typology Code available for payment source | Attending: Emergency Medicine | Admitting: Emergency Medicine

## 2022-02-26 ENCOUNTER — Emergency Department (HOSPITAL_COMMUNITY): Payer: No Typology Code available for payment source

## 2022-02-26 ENCOUNTER — Other Ambulatory Visit: Payer: Self-pay

## 2022-02-26 ENCOUNTER — Encounter (HOSPITAL_COMMUNITY): Payer: Self-pay | Admitting: Emergency Medicine

## 2022-02-26 DIAGNOSIS — Y900 Blood alcohol level of less than 20 mg/100 ml: Secondary | ICD-10-CM | POA: Diagnosis not present

## 2022-02-26 DIAGNOSIS — G43809 Other migraine, not intractable, without status migrainosus: Secondary | ICD-10-CM | POA: Insufficient documentation

## 2022-02-26 DIAGNOSIS — H538 Other visual disturbances: Secondary | ICD-10-CM | POA: Diagnosis present

## 2022-02-26 LAB — COMPREHENSIVE METABOLIC PANEL
ALT: 18 U/L (ref 0–44)
AST: 15 U/L (ref 15–41)
Albumin: 4.5 g/dL (ref 3.5–5.0)
Alkaline Phosphatase: 54 U/L (ref 38–126)
Anion gap: 8 (ref 5–15)
BUN: 7 mg/dL (ref 6–20)
CO2: 25 mmol/L (ref 22–32)
Calcium: 9.7 mg/dL (ref 8.9–10.3)
Chloride: 108 mmol/L (ref 98–111)
Creatinine, Ser: 0.84 mg/dL (ref 0.44–1.00)
GFR, Estimated: >60 mL/min (ref >60)
Glucose, Bld: 92 mg/dL (ref 70–99)
Potassium: 4.2 mmol/L (ref 3.5–5.1)
Sodium: 141 mmol/L (ref 135–145)
Total Bilirubin: 0.9 mg/dL (ref 0.3–1.2)
Total Protein: 7.6 g/dL (ref 6.5–8.1)

## 2022-02-26 LAB — I-STAT CHEM 8, ED
BUN: 7 mg/dL (ref 6–20)
Calcium, Ion: 1.21 mmol/L (ref 1.15–1.40)
Chloride: 105 mmol/L (ref 98–111)
Creatinine, Ser: 0.7 mg/dL (ref 0.44–1.00)
Glucose, Bld: 91 mg/dL (ref 70–99)
HCT: 47 % — ABNORMAL HIGH (ref 36.0–46.0)
Hemoglobin: 16 g/dL — ABNORMAL HIGH (ref 12.0–15.0)
Potassium: 4.3 mmol/L (ref 3.5–5.1)
Sodium: 141 mmol/L (ref 135–145)
TCO2: 24 mmol/L (ref 22–32)

## 2022-02-26 LAB — CBC
HCT: 46.5 % — ABNORMAL HIGH (ref 36.0–46.0)
Hemoglobin: 15.5 g/dL — ABNORMAL HIGH (ref 12.0–15.0)
MCH: 28.8 pg (ref 26.0–34.0)
MCHC: 33.3 g/dL (ref 30.0–36.0)
MCV: 86.4 fL (ref 80.0–100.0)
Platelets: 219 10*3/uL (ref 150–400)
RBC: 5.38 MIL/uL — ABNORMAL HIGH (ref 3.87–5.11)
RDW: 13.2 % (ref 11.5–15.5)
WBC: 4.4 10*3/uL (ref 4.0–10.5)
nRBC: 0 % (ref 0.0–0.2)

## 2022-02-26 LAB — DIFFERENTIAL
Abs Immature Granulocytes: 0.01 10*3/uL (ref 0.00–0.07)
Basophils Absolute: 0 10*3/uL (ref 0.0–0.1)
Basophils Relative: 1 %
Eosinophils Absolute: 0.1 10*3/uL (ref 0.0–0.5)
Eosinophils Relative: 3 %
Immature Granulocytes: 0 %
Lymphocytes Relative: 21 %
Lymphs Abs: 0.9 10*3/uL (ref 0.7–4.0)
Monocytes Absolute: 0.4 10*3/uL (ref 0.1–1.0)
Monocytes Relative: 8 %
Neutro Abs: 2.9 10*3/uL (ref 1.7–7.7)
Neutrophils Relative %: 67 %

## 2022-02-26 LAB — PROTIME-INR
INR: 1.1 (ref 0.8–1.2)
Prothrombin Time: 13.7 seconds (ref 11.4–15.2)

## 2022-02-26 LAB — APTT: aPTT: 29 seconds (ref 24–36)

## 2022-02-26 LAB — ETHANOL: Alcohol, Ethyl (B): <10 mg/dL (ref <10)

## 2022-02-26 LAB — I-STAT BETA HCG BLOOD, ED (MC, WL, AP ONLY): I-stat hCG, quantitative: <5 m[IU]/mL (ref <5)

## 2022-02-26 MED ORDER — ACETAMINOPHEN 325 MG PO TABS: 650.0000 mg | ORAL_TABLET | Freq: Four times a day (QID) | ORAL | 0 refills | Status: AC | PRN

## 2022-02-26 MED ORDER — METOCLOPRAMIDE HCL 5 MG PO TABS: 5.0000 mg | ORAL_TABLET | Freq: Four times a day (QID) | ORAL | 0 refills | Status: AC | PRN

## 2022-02-26 MED ORDER — METOCLOPRAMIDE HCL 5 MG/ML IJ SOLN
10.0000 mg | Freq: Once | INTRAMUSCULAR | Status: AC
  Administered 2022-02-26: 10 mg via INTRAVENOUS
  Filled 2022-02-26: qty 2

## 2022-02-26 MED ORDER — DIPHENHYDRAMINE HCL 50 MG/ML IJ SOLN
12.5000 mg | Freq: Once | INTRAMUSCULAR | Status: AC
  Administered 2022-02-26: 12.5 mg via INTRAVENOUS
  Filled 2022-02-26: qty 1

## 2022-02-26 MED ORDER — IBUPROFEN 600 MG PO TABS: 600.0000 mg | ORAL_TABLET | Freq: Four times a day (QID) | ORAL | 0 refills | Status: DC | PRN

## 2022-02-26 MED ORDER — KETOROLAC TROMETHAMINE 15 MG/ML IJ SOLN
15.0000 mg | Freq: Once | INTRAMUSCULAR | Status: AC
  Administered 2022-02-26: 15 mg via INTRAVENOUS
  Filled 2022-02-26: qty 1

## 2022-02-26 MED ORDER — SODIUM CHLORIDE 0.9 % IV BOLUS
1000.0000 mL | Freq: Once | INTRAVENOUS | Status: AC
  Administered 2022-02-26: 1000 mL via INTRAVENOUS

## 2022-02-26 MED ORDER — SODIUM CHLORIDE 0.9% FLUSH
3.0000 mL | Freq: Once | INTRAVENOUS | Status: AC
  Administered 2022-02-26: 3 mL via INTRAVENOUS

## 2022-02-26 NOTE — ED Provider Triage Note (Signed)
Emergency Medicine Provider Triage Evaluation Note  Penny Ponce , a 31 y.o. female  was evaluated in triage.  Pt complains of headache. States this morning around 9 am she developed some vision changes that she described as 'looking through water.' States that that resolved in about 10 minutes and then very soon after that she developed a severe headache that is located behind her eyes and radiates back throughout her head. States that around 10-10:30 she had aphasia and confusion where she could not understand what the person she was on the phone with was saying and she could not speak correct words to the person she was talking to. States that this lasted around 10 minutes and then resolved. States that she still has a severe headache with photophobia. States she has never had anything like this happen before, no history of migraine.  Review of Systems  Positive:  Negative:   Physical Exam  BP (!) 137/93 (BP Location: Right Arm)   Pulse 84   Temp 97.9 F (36.6 C) (Oral)   Resp 17   SpO2 98%  Gen:   Awake, no distress   Resp:  Normal effort  MSK:   Moves extremities without difficulty  Other:  Alert and oriented without any focal neurologic deficits  Medical Decision Making  Medically screening exam initiated at 12:39 PM.  Appropriate orders placed.  Marijo Conception was informed that the remainder of the evaluation will be completed by another provider, this initial triage assessment does not replace that evaluation, and the importance of remaining in the ED until their evaluation is complete.     Silva Bandy, PA-C 02/26/22 1244

## 2022-02-26 NOTE — ED Provider Notes (Signed)
[facility] EMERGENCY DEPARTMENT Provider Note   CSN: [id number] Arrival date & time: 02/26/22  1155     History  Chief Complaint  Patient presents with   Migraine    Penny Ponce is a 31 y.o. female with medical history of hypothyroidism.  The patient presents to the ED for evaluation of right-sided visual field deficit, headache.  The patient states that this morning around 9:30 AM she developed right-sided peripheral visual change described as "looking through water".  The patient states that this lasted for about 10 to 15 minutes and resolved on its own.  Patient states that this time she developed acute headache described as worse headache of her life, denies history of headaches or migraines.  The patient states that she took Tylenol/ibuprofen at this time without relief.  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  The patient was also unable to read and understand words on the paper in front of her.  The patient reports that this lasted for 10 minutes and then resolved.  Patient states that headache continues on examination however denies any aphasia, unilateral weakness or numbness, syncope, nausea, vomiting, visual field deficit.   Migraine Associated symptoms include headaches.       Home Medications Prior to Admission medications   Medication Sig Start Date End Date Taking? Authorizing Provider  acetaminophen (TYLENOL) 325 MG tablet Take 2 tablets (650 mg total) by mouth every 6 (six) hours as needed. 02/26/22  Yes Tanda Rockers A, DO  ibuprofen (ADVIL) 600 MG tablet Take 1 tablet (600 mg total) by mouth every 6 (six) hours as needed. 02/26/22  Yes Sloan Leiter, DO  metoCLOPramide (REGLAN) 5 MG tablet Take 1 tablet (5 mg total) by mouth every 6 (six) hours as needed (headache). 02/26/22  Yes Tanda Rockers A, DO  ibuprofen (ADVIL) 600 MG tablet Take 1  tablet (600 mg total) by mouth every 6 (six) hours. 04/04/19   Karena Addison, CNM  levothyroxine (SYNTHROID, LEVOTHROID) 150 MCG tablet Take 1 tablet (150 mcg total) by mouth daily before breakfast. 11/26/15   Sharee Pimple, CNM  Prenatal Vit-Fe Fumarate-FA (PRENATAL MULTIVITAMIN) TABS tablet Take 1 tablet by mouth daily at 12 noon.    [provider]  Probiotic Product (PROBIOTIC-10 PO) Take 1 capsule by mouth daily.     [provider]      Allergies    Patient has no known allergies.    Review of Systems   Review of Systems  Eyes:  Negative for visual disturbance.  Gastrointestinal:  Negative for nausea and vomiting.  Neurological:  Positive for headaches. Negative for syncope, weakness and numbness.  All other systems reviewed and are negative.   Physical Exam Updated Vital Signs BP 116/88 (BP Location: Left Arm)   Pulse 75   Temp 98.1 F (36.7 C) (Oral)   Resp 18   SpO2 100%  Physical Exam Vitals and nursing note reviewed.  Constitutional:      General: She is not in acute distress.    Appearance: Normal appearance. She is not ill-appearing, toxic-appearing or diaphoretic.  HENT:     Head: Normocephalic and atraumatic.     Nose: Nose normal. No congestion.     Mouth/Throat:     Mouth: Mucous membranes are moist.     Pharynx: Oropharynx is clear.  Eyes:  Extraocular Movements: Extraocular movements intact.     Conjunctiva/sclera: Conjunctivae normal.     Pupils: Pupils are equal, round, and reactive to light.  Cardiovascular:     Rate and Rhythm: Normal rate and regular rhythm.  Pulmonary:     Effort: Pulmonary effort is normal.     Breath sounds: Normal breath sounds. No wheezing.  Abdominal:     General: Abdomen is flat. Bowel sounds are normal.     Palpations: Abdomen is soft.     Tenderness: There is no abdominal tenderness. There is no right CVA tenderness or left CVA tenderness.  Musculoskeletal:     Cervical back: Normal range of  motion and neck supple. No tenderness.  Skin:    General: Skin is warm and dry.     Capillary Refill: Capillary refill takes less than 2 seconds.  Neurological:     General: No focal deficit present.     Mental Status: She is alert and oriented to person, place, and time.     GCS: GCS eye subscore is 4. GCS verbal subscore is 5. GCS motor subscore is 6.     Cranial Nerves: Cranial nerves 2-12 are intact. No cranial nerve deficit.     Sensory: Sensation is intact. No sensory deficit.     Motor: Motor function is intact. No weakness.     Coordination: Coordination is intact. Heel to [facility] Test normal.     ED Results / Procedures / Treatments   Labs (all labs ordered are listed, but only abnormal results are displayed) Labs Reviewed  CBC - Abnormal; Notable for the following components:      Result Value   RBC 5.38 (*)    Hemoglobin 15.5 (*)    HCT 46.5 (*)    All other components within normal limits  I-STAT CHEM 8, ED - Abnormal; Notable for the following components:   Hemoglobin 16.0 (*)    HCT 47.0 (*)    All other components within normal limits  PROTIME-INR  APTT  DIFFERENTIAL  COMPREHENSIVE METABOLIC PANEL  ETHANOL  I-STAT BETA HCG BLOOD, ED ([facility], [facility], [facility] ONLY)    EKG None  Radiology No results found.  Procedures Procedures   Medications Ordered in ED Medications  sodium chloride flush (NS) 0.9 % injection 3 mL (3 mLs Intravenous Given 02/26/22 1450)  sodium chloride 0.9 % bolus 1,000 mL (0 mLs Intravenous Stopped 02/26/22 1827)  metoCLOPramide (REGLAN) injection 10 mg (10 mg Intravenous Given 02/26/22 1444)  ketorolac (TORADOL) 15 MG/ML injection 15 mg (15 mg Intravenous Given 02/26/22 1446)  diphenhydrAMINE (BENADRYL) injection 12.5 mg (12.5 mg Intravenous Given 02/26/22 1447)    ED Course/ Medical Decision Making/ A&P Clinical Course as of 03/07/22 0656  Wed Feb 26, 2022  1949 ABCD2 score is LOW RISK, she is asymptomatic. D/w neuro on call; recommend o/p f/u  [SG]    Clinical Course User Index [SG] Sloan Leiter, DO                           Medical Decision Making Amount and/or Complexity of Data Reviewed Labs: ordered. Radiology: ordered.  Risk OTC drugs. Prescription drug management.   31 year old female presents to ED for evaluation.  Please see HPI for further details.  On examination, the patient is afebrile and nontachycardic.  The patient lung sounds are clear bilaterally, she is not hypoxic.  The patient abdomen is soft and compressible in all 4 quadrants.  The  patient neurological examination shows no focal neurodeficits.  The patient visual fields are inspected without findings of visual field deficits.  Patient treated with 12.5 mg Benadryl, 15 mg Toradol, 10 mg Reglan, 1 L normal saline for migraine.  Patient reports migraine has resolved itself after medications were given.  Patient worked up utilizing the following labs and imaging studies interpreted by me personally: - CBC unremarkable - CMP unremarkable - PT/INR unremarkable - APTT unremarkable - Ethanol unremarkable - CT head shows no findings of intracranial abnormalities - Due to patient's symptoms, clinical presentation, decision was made to MRI patient.  At the end of my shift, patient was still waiting to undergo the study.  This patient was signed out to my colleague/attending Dr. Tanda Rockers for further disposition.  Final Clinical Impression(s) / ED Diagnoses Final diagnoses:  Other migraine without status migrainosus, not intractable    Rx / DC Orders ED Discharge Orders          Ordered    Ambulatory referral to Neurology       Comments: An appointment is requested in approximately: 1 week   02/26/22 2008    metoCLOPramide (REGLAN) 5 MG tablet  Every 6 hours PRN        02/26/22 2008    acetaminophen (TYLENOL) 325 MG tablet  Every 6 hours PRN        02/26/22 2008    ibuprofen (ADVIL) 600 MG tablet  Every 6 hours PRN        02/26/22 2008               Al Decant, PA-C 03/07/22 0657    Franne Forts, DO 03/10/22 [phone]

## 2022-02-26 NOTE — ED Provider Notes (Signed)
  Provider Note MRN:  [id number]  Arrival date & time: 02/26/22    ED Course and Medical Decision Making  Assumed care from Ulyana Pitones/Groce at shift change.  See note from prior team for complete details, in brief:  31yo female To ED for HA A/w peripheral vision change around 0930 am today Right sided visual field; felt like she was "looking through water" Symptoms improved with tylenol/motrin after around 10-15 minutes Had transient difficulty reading or understanding speech for 10 mins and later resolved Migraine cocktail given and symptoms improved CTH negative MRI pending  On arrival she was having a headache but otherwise no symptoms  Plan per prior physician MRI, d/w neuro  MRI has resulted and is WNL, pt is asymptomatic currently, HA has resolved.  Gait steady, nonfocal neuro exam.   Discussed with neurology  Patient with likely complex migraine.  ABCD2 score is low.  Recommend discharge with close outpatient follow-up with neurology.  Patient is agreeable.  The patient improved significantly and was discharged in stable condition. Detailed discussions were had with the patient regarding current findings, and need for close f/u with PCP or on call doctor. The patient has been instructed to return immediately if the symptoms worsen in any way for re-evaluation. Patient verbalized understanding and is in agreement with current care plan. All questions answered prior to discharge.   Procedures  Final Clinical Impressions(s) / ED Diagnoses     ICD-10-CM   1. Other migraine without status migrainosus, not intractable  G43.809       ED Discharge Orders     None       Discharge Instructions   None        Sloan Leiter, DO 02/26/22 2005

## 2022-02-26 NOTE — ED Notes (Signed)
DC instructions reviewed with pt. PT verbalized understanding. PT DC °

## 2022-02-26 NOTE — ED Notes (Signed)
Patient transported to MRI 

## 2022-02-26 NOTE — ED Triage Notes (Signed)
Patient complains of migraine that started earlier today and states that she noticed that the right of her peripheral vision was distorted as if she was looking through water. Patient states she noticed that when she was trying to make a phone call and was unable to say the words she intended. Patient states aphasia started at approximately 1030 but resolved spontaneously. Patient reports headache persists. Patient is alert, oriented, ambulatory, and in no apparent distress at this time.

## 2022-02-26 NOTE — Discharge Instructions (Signed)
It was a pleasure caring for you today in the emergency department. ° °Please return to the emergency department for any worsening or worrisome symptoms. ° ° °

## 2022-04-03 ENCOUNTER — Encounter: Payer: Self-pay | Admitting: Psychiatry

## 2022-04-03 ENCOUNTER — Ambulatory Visit (INDEPENDENT_AMBULATORY_CARE_PROVIDER_SITE_OTHER): Payer: No Typology Code available for payment source | Admitting: Psychiatry

## 2022-04-03 VITALS — BP 126/83 | HR 98 | Ht 65.0 in | Wt 169.4 lb

## 2022-04-03 DIAGNOSIS — G43109 Migraine with aura, not intractable, without status migrainosus: Secondary | ICD-10-CM

## 2022-04-03 MED ORDER — RIZATRIPTAN BENZOATE 10 MG PO TABS: 10.0000 mg | ORAL_TABLET | ORAL | 6 refills | Status: AC | PRN

## 2022-04-03 NOTE — Progress Notes (Signed)
Referring:  Sloan Leiter, DO [address] [place],  [place] [address]  PCP: Lauro Regulus, MD  Neurology was asked to evaluate Penny Ponce, a 31 year old female for a chief complaint of headaches.  Our recommendations of care will be communicated by shared medical record.    CC:  headaches  History provided from self  HPI:  Medical co-morbidities: hypothyroidism  The patient presents for evaluation of headaches. On 02/26/22 around 9:30 AM she developed vision changes in her right eye. Looked like she has a "water droplet" in her eye. This lasted for about 30 minutes then resolved on its own. She then developed a severe throbbing headache with photophobia and phonophobia. Took Tylenol/Motrin but this did not resolve the headache. About an hour later she was unable to articulate her thoughts well and had trouble forming words. She could read words on the computer screen but had trouble getting them out or writing them down. Understood what people were saying to her. This only lasted for a few minutes, but severe headache persisted. She presented to the ED where MRI brain was unremarkable. She was given a migraine cocktail which helped resolve her headache. The headache lasted for several hours until it resolved.  She has not had any more episodes like this since the ED visit. She has had 2-3 dull headaches but nothing severe.  She does not have a prior history of migraines. Has had mild headaches before but nothing as severe as the episode in July.  Headache History: Onset: July 2023 Triggers: none Aura: visual aura, speech difficulty Location: holocephalic Quality/Description: throbbing Associated Symptoms:  Photophobia: yes  Phonophobia: yes  Nausea: no Worse with activity?: yes Duration of headaches: several hours  Headache days per month: 1 Headache free days per month: 29  Current Treatment: Abortive Tylenol Motrin  Preventative none  Prior Therapies                                  Tylenol Motrin   LABS: CBC    Component Value Date/Time   WBC 4.4 02/26/2022 1233   RBC 5.38 (H) 02/26/2022 1233   HGB 16.0 (H) 02/26/2022 1241   HGB 15.2 06/07/2013 0618   HCT 47.0 (H) 02/26/2022 1241   HCT 43.5 06/07/2013 0618   PLT 219 02/26/2022 1233   PLT 172 06/07/2013 0618   MCV 86.4 02/26/2022 1233   MCV 85 06/07/2013 0618   MCH 28.8 02/26/2022 1233   MCHC 33.3 02/26/2022 1233   RDW 13.2 02/26/2022 1233   RDW 13.2 06/07/2013 0618   LYMPHSABS 0.9 02/26/2022 1233   MONOABS 0.4 02/26/2022 1233   EOSABS 0.1 02/26/2022 1233   BASOSABS 0.0 02/26/2022 1233      Latest Ref Rng & Units 02/26/2022   12:41 PM 02/26/2022   12:33 PM 06/07/2013    6:18 AM  CMP  Glucose 70 - 99 mg/dL 91  92  91   BUN 6 - 20 mg/dL 7  7  11    Creatinine 0.44 - 1.00 mg/dL  6.06  3.01   Sodium 135 - 145 mmol/L 141  141  137   Potassium 3.5 - 5.1 mmol/L 4.3  4.2  3.9   Chloride 98 - 111 mmol/L 105  108  107   CO2 22 - 32 mmol/L  25  25   Calcium 8.9 - 10.3 mg/dL  9.7  9.2  Total Protein 6.5 - 8.1 g/dL  7.6  8.0   Total Bilirubin 0.3 - 1.2 mg/dL  0.9  1.1   Alkaline Phos 38 - 126 U/L  54  98   AST 15 - 41 U/L  15  38   ALT 0 - 44 U/L  18  19      IMAGING:  MRI brain 02/26/22: unremarkable  Imaging independently reviewed on April 03, 2022   Current Outpatient Medications on File Prior to Visit  Medication Sig Dispense Refill   acetaminophen (TYLENOL) 325 MG tablet Take 2 tablets (650 mg total) by mouth every 6 (six) hours as needed. 36 tablet 0   amoxicillin-clavulanate (AUGMENTIN) 875-125 MG tablet Take 1 tablet by mouth 2 (two) times daily. 04/03/22 Will finish Wynelle Link     ibuprofen (ADVIL) 600 MG tablet Take 1 tablet (600 mg total) by mouth every 6 (six) hours. 30 tablet 0   levothyroxine (SYNTHROID) 137 MCG tablet Take by mouth daily.     metoCLOPramide (REGLAN) 5 MG tablet Take 1 tablet (5 mg total) by mouth every 6 (six) hours as needed (headache). 14 tablet 0    No current facility-administered medications on file prior to visit.     Allergies: No Known Allergies  Family History: Migraine or other headaches in the family:  no Aneurysms in a first degree relative:  no Brain tumors in the family:  no Other neurological illness in the family:   no  Past Medical History: Past Medical History:  Diagnosis Date   Hypothyroidism 08/04/2008   Migraine     Past Surgical History Past Surgical History:  Procedure Laterality Date   NO PAST SURGERIES      Social History: Social History   Tobacco Use   Smoking status: Never   Smokeless tobacco: Never  Substance Use Topics   Alcohol use: No   Drug use: No     ROS: Negative for fevers, chills. Positive for headaches, vision changes. All other systems reviewed and negative unless stated otherwise in HPI.   Physical Exam:   Vital Signs: BP 126/83   Pulse 98   Ht 5\' 5"  (1.651 m)   Wt 169 lb 6.4 oz (76.8 kg)   BMI 28.19 kg/m  GENERAL: well appearing,in no acute distress,alert SKIN:  Color, texture, turgor normal. No rashes or lesions HEAD:  Normocephalic/atraumatic. CV:  RRR RESP: Normal respiratory effort MSK: no tenderness to palpation over occiput, neck, or shoulders  NEUROLOGICAL: Mental Status: Alert, oriented to person, place and time,Follows commands Cranial Nerves: PERRL, visual fields intact to confrontation, extraocular movements intact, facial sensation intact, no facial droop or ptosis, hearing grossly intact, no dysarthria Motor: muscle strength 5/5 both upper and lower extremities Reflexes: 2+ throughout Sensation: intact to light touch all 4 extremities Coordination: Finger-to- nose-finger intact bilaterally Gait: normal-based   IMPRESSION: 31 year old female with a history of hypothyroidism who presents for evaluation of headaches and vision changes. MRI brain and neurological exam are normal. Her symptoms are most consistent with migraine with aura. Will  start Maxalt as needed for migraine rescue.  PLAN: -Rescue: Start Maxalt 10 mg PRN   I spent a total of 25 minutes chart reviewing and counseling the patient. Headache education was done. Discussed treatment options including acute medications. Discussed medication side effects, adverse reactions and drug interactions. Written educational materials and patient instructions outlining all of the above were given.  Follow-up: as needed    34, MD 04/03/2022   8:17  AM

## 2022-04-03 NOTE — Patient Instructions (Addendum)
Start Maxalt as needed for migraines. Take at the onset of migraine. If headache recurs or does not fully resolve, you may take a second dose after 2 hours. Please avoid taking more than 2 days per week or 10 days per month.  ---  Your headaches are consistent with migraines. Migraines are moderate to severe headaches which generally have throbbing pain, sensitivity to light/sound, and nausea.   Migraines can be preceded by neurological symptoms called auras. Auras can have many different symptoms including vision changes (zigzags, kaleidoscope vision, flashing lights, blurred vision), numbness and tingling (usually on once side of the hand or face), difficulty with language (reading or speaking), and weakness on one side. Many times these symptoms will occur before a migraine headache, but auras can occur without a headache as well. Auras can naturally change over time and may convert from one type to another (people with visual aura may eventually develop a sensory aura, etc).
